# Patient Record
Sex: Female | Born: 1940 | Race: White | Hispanic: No | Smoking: Former smoker
Health system: Southern US, Community
[De-identification: ages and names within clinical notes are randomized; demographics above are authoritative.]

## PROBLEM LIST (undated history)

## (undated) DIAGNOSIS — Z8673 Personal history of transient ischemic attack (TIA), and cerebral infarction without residual deficits: Secondary | ICD-10-CM

## (undated) DIAGNOSIS — E785 Hyperlipidemia, unspecified: Secondary | ICD-10-CM

## (undated) DIAGNOSIS — Z87891 Personal history of nicotine dependence: Secondary | ICD-10-CM

## (undated) DIAGNOSIS — C569 Malignant neoplasm of unspecified ovary: Secondary | ICD-10-CM

## (undated) DIAGNOSIS — R000 Tachycardia, unspecified: Secondary | ICD-10-CM

## (undated) DIAGNOSIS — F32A Depression, unspecified: Secondary | ICD-10-CM

## (undated) DIAGNOSIS — F419 Anxiety disorder, unspecified: Secondary | ICD-10-CM

## (undated) DIAGNOSIS — E669 Obesity, unspecified: Secondary | ICD-10-CM

## (undated) HISTORY — PX: TOTAL MASTECTOMY: SHX6129

## (undated) HISTORY — DX: Anxiety disorder, unspecified: F41.9

## (undated) HISTORY — DX: Obesity, unspecified: E66.9

## (undated) HISTORY — DX: Depression, unspecified: F32.A

## (undated) HISTORY — PX: BREAST SURGERY: SHX581

## (undated) HISTORY — DX: Personal history of transient ischemic attack (TIA), and cerebral infarction without residual deficits: Z86.73

## (undated) HISTORY — DX: Hyperlipidemia, unspecified: E78.5

## (undated) HISTORY — DX: Malignant neoplasm of unspecified ovary: C56.9

---

## 2005-04-01 ENCOUNTER — Emergency Department: Payer: Self-pay | Admitting: Emergency Medicine

## 2009-03-19 ENCOUNTER — Ambulatory Visit: Payer: Self-pay | Admitting: Internal Medicine

## 2009-03-20 ENCOUNTER — Ambulatory Visit: Payer: Self-pay | Admitting: Internal Medicine

## 2012-06-21 ENCOUNTER — Ambulatory Visit: Payer: Self-pay | Admitting: Family Medicine

## 2012-08-01 ENCOUNTER — Ambulatory Visit: Payer: Self-pay | Admitting: Family Medicine

## 2012-10-03 DIAGNOSIS — F411 Generalized anxiety disorder: Secondary | ICD-10-CM | POA: Insufficient documentation

## 2012-10-03 DIAGNOSIS — F32A Depression, unspecified: Secondary | ICD-10-CM | POA: Insufficient documentation

## 2013-07-13 ENCOUNTER — Other Ambulatory Visit: Payer: Self-pay

## 2013-07-13 LAB — CBC WITH DIFFERENTIAL/PLATELET
Basophil #: 0 10*3/uL (ref 0.0–0.1)
Basophil %: 0.2 %
Eosinophil #: 0 10*3/uL (ref 0.0–0.7)
Eosinophil %: 0.4 %
HCT: 25.2 % — ABNORMAL LOW (ref 35.0–47.0)
HGB: 8.9 g/dL — ABNORMAL LOW (ref 12.0–16.0)
MCH: 32.6 pg (ref 26.0–34.0)
MCHC: 35.4 g/dL (ref 32.0–36.0)
MCV: 92 fL (ref 80–100)
Monocyte #: 0.6 x10 3/mm (ref 0.2–0.9)
Monocyte %: 8.1 %
Neutrophil %: 73.8 %
Platelet: 49 10*3/uL — ABNORMAL LOW (ref 150–440)
RDW: 18.9 % — ABNORMAL HIGH (ref 11.5–14.5)

## 2013-07-26 IMAGING — CR DG IVP HYPERTENSIVE
1 series · 8 of 10 positions shown · non-contrast
Comparison: none

REASON FOR EXAM: hematuria
COMMENTS:

PROCEDURE:     DXR - DXR INTRAVENOUS UROGRAPHY (IVP)  - June 21, 2012  [DATE]
RESULT:     Indication: Hematuria

[Series 1: scout · 0.17mm/px · 8 of 16 slices shown]
[im 1/16]
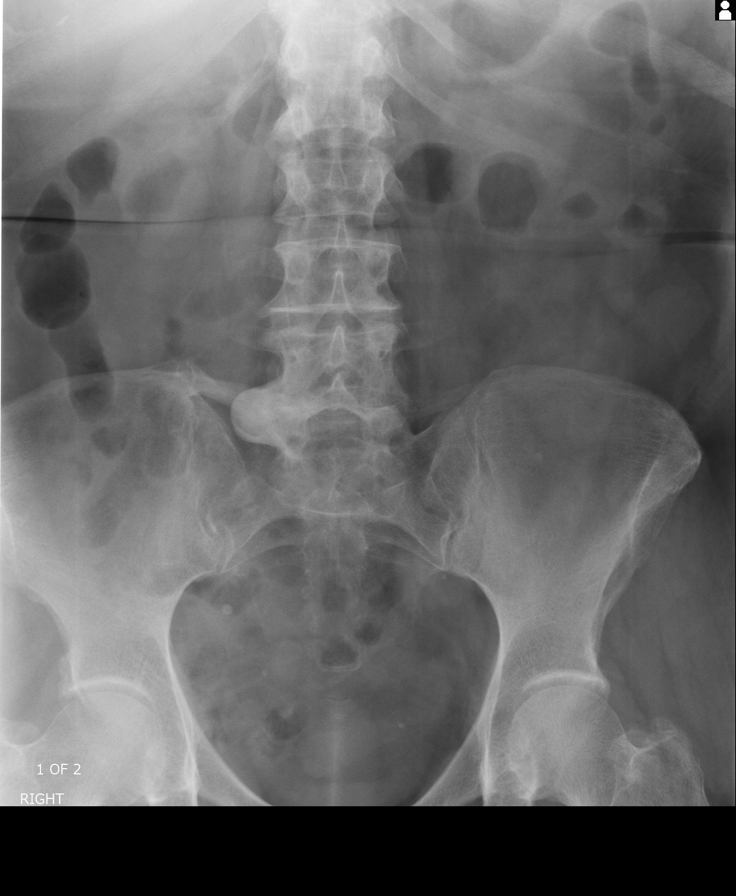
[im 2/16]
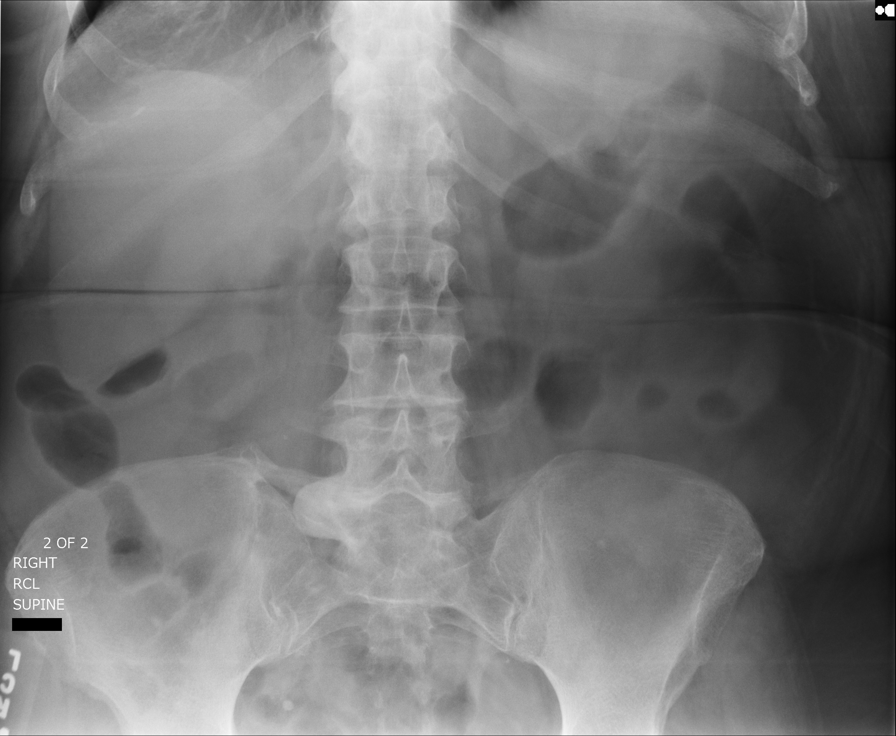
[im 4/16]
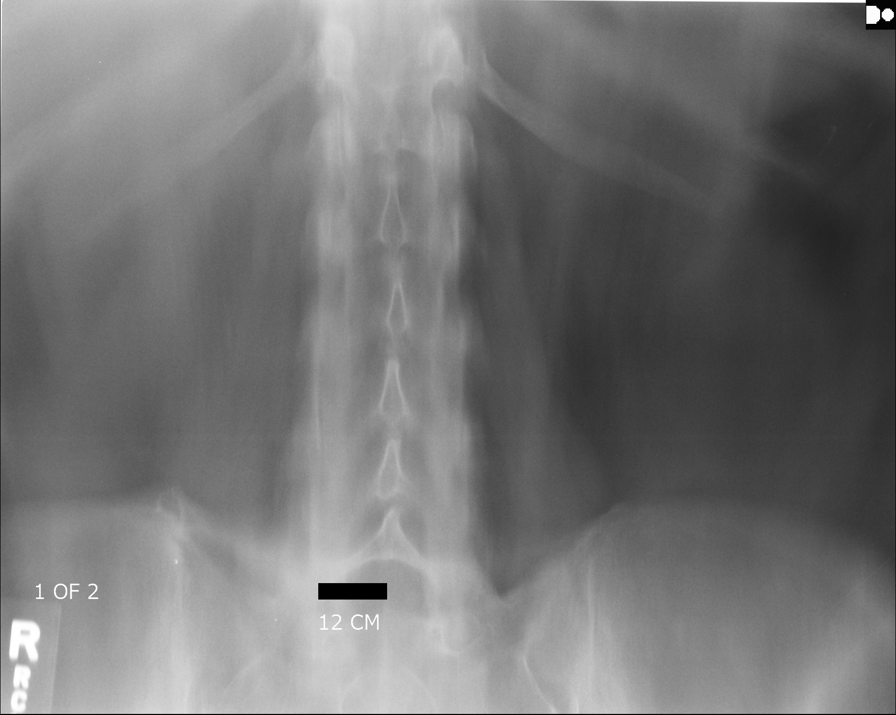
[im 6/16]
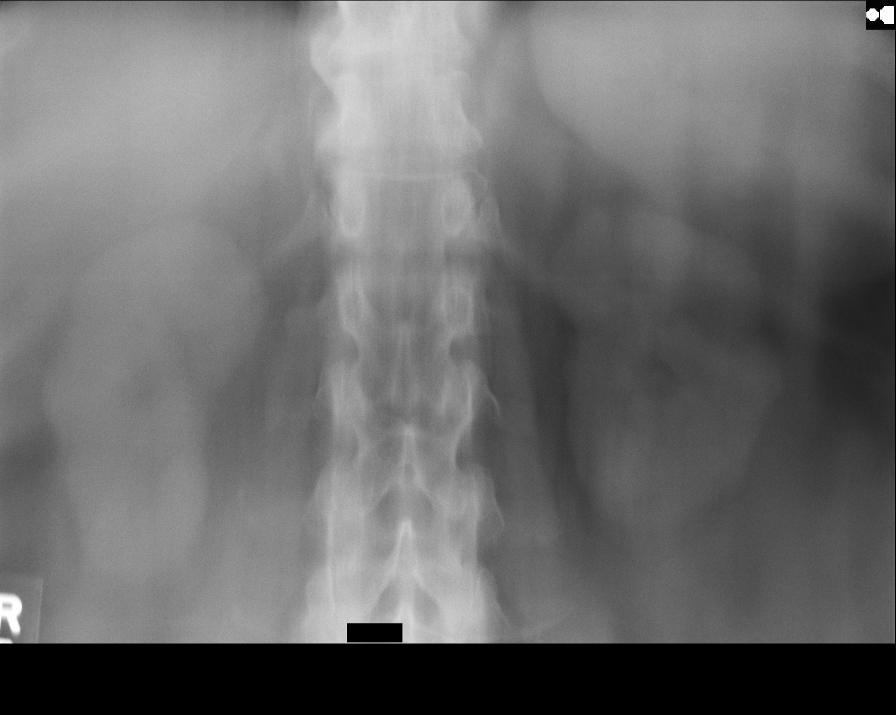
[im 7/16]
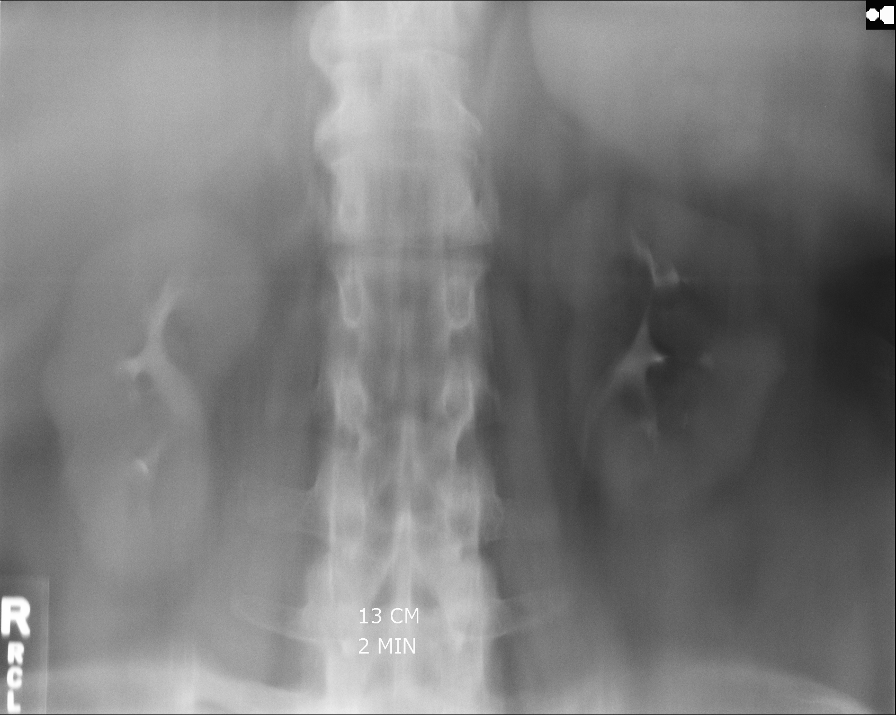
[im 9/16]
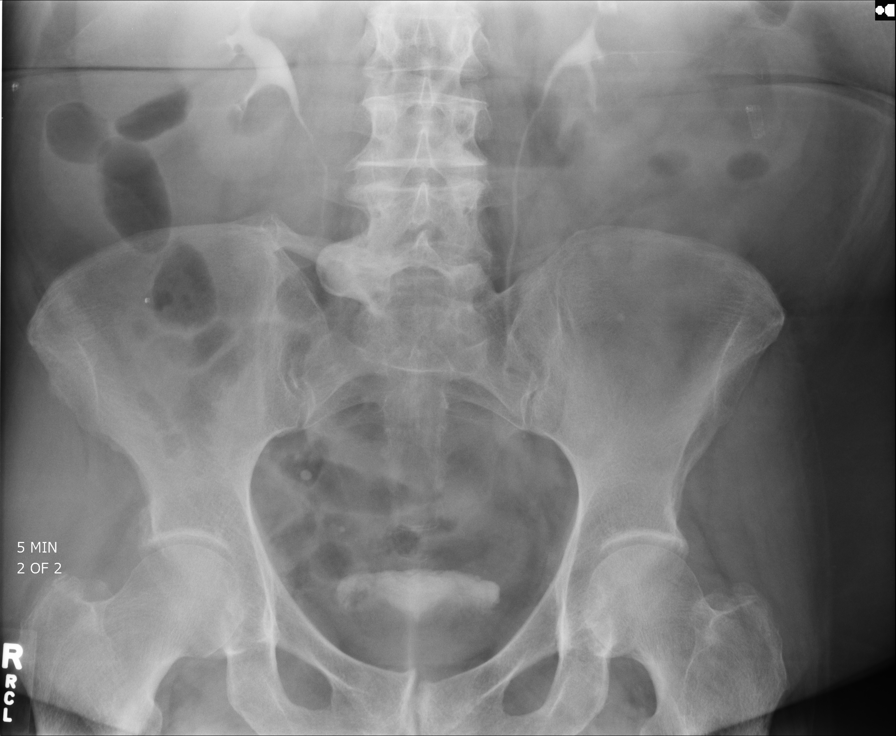
[im 11/16]
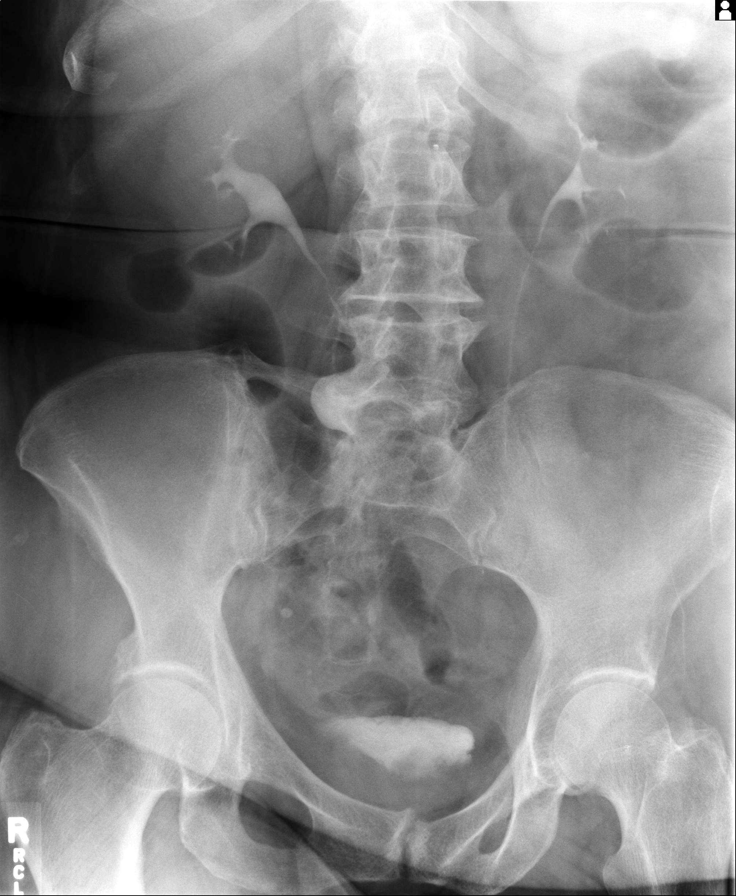
[im 12/16]
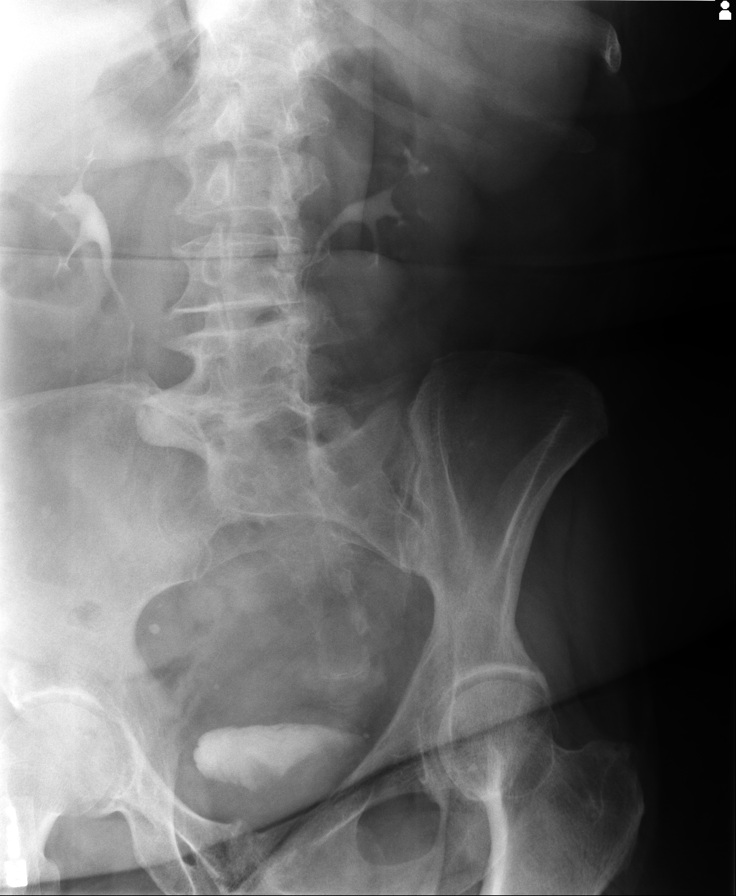

[8 of 10 positions shown; findings below may reference images not displayed]

FINDINGS: Scout frontal abdominal and pelvic radiograph demonstrates bowel gas in
multiple loops of nondilated small and large bowel. No evidence for bowel
obstruction or adynamic ileus. There are no calcific densities overlying the
kidneys to suggest renal calculi.

Precontrast tomograms were obtained at 12 cm. The kidneys are normal in
size. Renal contours are normal bilaterally without evidence for exophytic
mass.

The kidneys enhance symmetrically and excrete contrast promptly following
the uneventful administration of 50 mL of Bsovue-LYY. The overhead
radiograph demonstrates contrast within the proximal ureters bilaterally.
Compression tomograms reveal the collecting systems to be unremarkable with
sharp calyces and without evidence of filling defects or pelvicaliectasis.
The oblique radiographs demonstrate symmetric filling of the ureters. They
are of normal caliber without evidence of ureterectasis or filling defects.
Contrast is present in the bladder within 5 minutes and there are no
definite filling defects identified within the bladder.

Post void radiograph reveals a minimal amount of residual contrast within
the collecting systems and bladder.
IMPRESSION: 1. No evidence of stones or filling defects within the kidneys, ureter, or
bladder.

[REDACTED]

## 2016-03-04 ENCOUNTER — Inpatient Hospital Stay: Admit: 2016-03-04 | Payer: Self-pay

## 2016-06-06 DIAGNOSIS — Z87898 Personal history of other specified conditions: Secondary | ICD-10-CM | POA: Insufficient documentation

## 2018-12-03 DIAGNOSIS — E785 Hyperlipidemia, unspecified: Secondary | ICD-10-CM | POA: Insufficient documentation

## 2022-03-21 ENCOUNTER — Emergency Department: Payer: Medicare Other

## 2022-03-21 ENCOUNTER — Other Ambulatory Visit: Payer: Self-pay

## 2022-03-21 ENCOUNTER — Encounter: Payer: Self-pay | Admitting: Internal Medicine

## 2022-03-21 ENCOUNTER — Observation Stay
Admission: EM | Admit: 2022-03-21 | Discharge: 2022-03-22 | Disposition: A | Discharge status: Home / Self Care | Payer: Medicare Other | Attending: Obstetrics and Gynecology | Admitting: Obstetrics and Gynecology

## 2022-03-21 DIAGNOSIS — E669 Obesity, unspecified: Secondary | ICD-10-CM | POA: Diagnosis present

## 2022-03-21 DIAGNOSIS — N3001 Acute cystitis with hematuria: Secondary | ICD-10-CM

## 2022-03-21 DIAGNOSIS — Z87891 Personal history of nicotine dependence: Secondary | ICD-10-CM | POA: Diagnosis not present

## 2022-03-21 DIAGNOSIS — R000 Tachycardia, unspecified: Secondary | ICD-10-CM | POA: Diagnosis not present

## 2022-03-21 DIAGNOSIS — G459 Transient cerebral ischemic attack, unspecified: Secondary | ICD-10-CM

## 2022-03-21 DIAGNOSIS — I749 Embolism and thrombosis of unspecified artery: Secondary | ICD-10-CM

## 2022-03-21 DIAGNOSIS — Z8543 Personal history of malignant neoplasm of ovary: Secondary | ICD-10-CM | POA: Diagnosis not present

## 2022-03-21 DIAGNOSIS — R4781 Slurred speech: Secondary | ICD-10-CM

## 2022-03-21 DIAGNOSIS — R4701 Aphasia: Secondary | ICD-10-CM | POA: Diagnosis not present

## 2022-03-21 DIAGNOSIS — I4891 Unspecified atrial fibrillation: Secondary | ICD-10-CM

## 2022-03-21 DIAGNOSIS — Z79899 Other long term (current) drug therapy: Secondary | ICD-10-CM | POA: Diagnosis not present

## 2022-03-21 DIAGNOSIS — Z20822 Contact with and (suspected) exposure to covid-19: Secondary | ICD-10-CM | POA: Insufficient documentation

## 2022-03-21 DIAGNOSIS — R030 Elevated blood-pressure reading, without diagnosis of hypertension: Secondary | ICD-10-CM

## 2022-03-21 DIAGNOSIS — R4182 Altered mental status, unspecified: Secondary | ICD-10-CM | POA: Diagnosis not present

## 2022-03-21 DIAGNOSIS — R531 Weakness: Secondary | ICD-10-CM | POA: Diagnosis present

## 2022-03-21 DIAGNOSIS — R471 Dysarthria and anarthria: Secondary | ICD-10-CM | POA: Diagnosis not present

## 2022-03-21 DIAGNOSIS — I4811 Longstanding persistent atrial fibrillation: Secondary | ICD-10-CM | POA: Insufficient documentation

## 2022-03-21 HISTORY — DX: Obesity, unspecified: E66.9

## 2022-03-21 HISTORY — DX: Embolism and thrombosis of unspecified artery: G45.9

## 2022-03-21 HISTORY — DX: Personal history of nicotine dependence: Z87.891

## 2022-03-21 HISTORY — DX: Transient cerebral ischemic attack, unspecified: I74.9

## 2022-03-21 HISTORY — DX: Tachycardia, unspecified: R00.0

## 2022-03-21 HISTORY — DX: Unspecified atrial fibrillation: I48.91

## 2022-03-21 LAB — CBC WITH DIFFERENTIAL/PLATELET
Abs Immature Granulocytes: 0.02 10*3/uL (ref 0.00–0.07)
Basophils Absolute: 0 10*3/uL (ref 0.0–0.1)
Basophils Relative: 0 %
Eosinophils Absolute: 0.2 10*3/uL (ref 0.0–0.5)
Eosinophils Relative: 2 %
HCT: 40.9 % (ref 36.0–46.0)
Hemoglobin: 12.9 g/dL (ref 12.0–15.0)
Immature Granulocytes: 0 %
Lymphocytes Relative: 21 %
Lymphs Abs: 1.8 10*3/uL (ref 0.7–4.0)
MCH: 31.1 pg (ref 26.0–34.0)
MCHC: 31.5 g/dL (ref 30.0–36.0)
MCV: 98.6 fL (ref 80.0–100.0)
Monocytes Absolute: 1.1 10*3/uL — ABNORMAL HIGH (ref 0.1–1.0)
Monocytes Relative: 13 %
Neutro Abs: 5.3 10*3/uL (ref 1.7–7.7)
Neutrophils Relative %: 64 %
Platelets: 167 10*3/uL (ref 150–400)
RBC: 4.15 MIL/uL (ref 3.87–5.11)
RDW: 12.7 % (ref 11.5–15.5)
WBC: 8.4 10*3/uL (ref 4.0–10.5)
nRBC: 0 % (ref 0.0–0.2)

## 2022-03-21 LAB — BASIC METABOLIC PANEL
Anion gap: 12 (ref 5–15)
BUN: 23 mg/dL (ref 8–23)
CO2: 24 mmol/L (ref 22–32)
Calcium: 8.6 mg/dL — ABNORMAL LOW (ref 8.9–10.3)
Chloride: 102 mmol/L (ref 98–111)
Creatinine, Ser: 0.99 mg/dL (ref 0.44–1.00)
GFR, Estimated: 58 mL/min — ABNORMAL LOW (ref >60)
Glucose, Bld: 99 mg/dL (ref 70–99)
Potassium: 3.6 mmol/L (ref 3.5–5.1)
Sodium: 138 mmol/L (ref 135–145)

## 2022-03-21 LAB — ETHANOL: Alcohol, Ethyl (B): <10 mg/dL (ref <10)

## 2022-03-21 LAB — COMPREHENSIVE METABOLIC PANEL
ALT: 7 U/L (ref 0–44)
AST: 11 U/L — ABNORMAL LOW (ref 15–41)
Albumin: 3.6 g/dL (ref 3.5–5.0)
Alkaline Phosphatase: 73 U/L (ref 38–126)
Anion gap: 13 (ref 5–15)
BUN: 22 mg/dL (ref 8–23)
CO2: 24 mmol/L (ref 22–32)
Calcium: 8.8 mg/dL — ABNORMAL LOW (ref 8.9–10.3)
Chloride: 104 mmol/L (ref 98–111)
Creatinine, Ser: 1.03 mg/dL — ABNORMAL HIGH (ref 0.44–1.00)
GFR, Estimated: 55 mL/min — ABNORMAL LOW (ref >60)
Glucose, Bld: 104 mg/dL — ABNORMAL HIGH (ref 70–99)
Potassium: 3.5 mmol/L (ref 3.5–5.1)
Sodium: 141 mmol/L (ref 135–145)
Total Bilirubin: 1 mg/dL (ref 0.3–1.2)
Total Protein: 7.3 g/dL (ref 6.5–8.1)

## 2022-03-21 LAB — CBG MONITORING, ED: Glucose-Capillary: 94 mg/dL (ref 70–99)

## 2022-03-21 LAB — URINALYSIS, ROUTINE W REFLEX MICROSCOPIC
Bilirubin Urine: NEGATIVE
Glucose, UA: NEGATIVE mg/dL
Hgb urine dipstick: NEGATIVE
Ketones, ur: NEGATIVE mg/dL
Nitrite: POSITIVE — AB
Protein, ur: NEGATIVE mg/dL
Specific Gravity, Urine: >1.046 — ABNORMAL HIGH (ref 1.005–1.030)
pH: 5 (ref 5.0–8.0)

## 2022-03-21 LAB — URINE DRUG SCREEN, QUALITATIVE (ARMC ONLY)
Amphetamines, Ur Screen: NOT DETECTED
Barbiturates, Ur Screen: NOT DETECTED
Benzodiazepine, Ur Scrn: NOT DETECTED
Cannabinoid 50 Ng, Ur ~~LOC~~: NOT DETECTED
Cocaine Metabolite,Ur ~~LOC~~: NOT DETECTED
MDMA (Ecstasy)Ur Screen: NOT DETECTED
Methadone Scn, Ur: NOT DETECTED
Opiate, Ur Screen: NOT DETECTED
Phencyclidine (PCP) Ur S: NOT DETECTED
Tricyclic, Ur Screen: NOT DETECTED

## 2022-03-21 LAB — RESP PANEL BY RT-PCR (FLU A&B, COVID) ARPGX2
Influenza A by PCR: NEGATIVE
Influenza B by PCR: NEGATIVE
SARS Coronavirus 2 by RT PCR: NEGATIVE

## 2022-03-21 LAB — APTT: aPTT: 30 seconds (ref 24–36)

## 2022-03-21 LAB — PROTIME-INR
INR: 1.1 (ref 0.8–1.2)
Prothrombin Time: 14.1 seconds (ref 11.4–15.2)

## 2022-03-21 MED ORDER — SODIUM CHLORIDE 0.9 % IV SOLN
2.0000 g | Freq: Once | INTRAVENOUS | Status: AC
  Administered 2022-03-21: 2 g via INTRAVENOUS
  Filled 2022-03-21: qty 20

## 2022-03-21 MED ORDER — IOHEXOL 350 MG/ML SOLN
75.0000 mL | Freq: Once | INTRAVENOUS | Status: AC | PRN
  Administered 2022-03-21: 75 mL via INTRAVENOUS

## 2022-03-21 MED ORDER — LACTATED RINGERS IV BOLUS
1000.0000 mL | Freq: Once | INTRAVENOUS | Status: AC
  Administered 2022-03-21: 1000 mL via INTRAVENOUS

## 2022-03-21 NOTE — ED Notes (Signed)
Pt to CT at this time.

## 2022-03-21 NOTE — Consult Note (Signed)
[facility] Telemedicine Consult ? ? ?Requesting Provider: Dr. Su Hoff ?Consult Participants: Dr. Jerelyn Charles, Telespecialist RN-Brittany   bedside RN Katie/Bill ?Location of the provider: Home ?Location of the patient: [facility] ER bed 6 ? ?This consult was provided via telemedicine with 2-way video and audio communication. The patient/family was informed that care would be provided in this way and agreed to receive care in this manner.  ? ? ?Chief Complaint: Weakness, aphasia, slurred speech ? ?HPI: 81 year old woman who has a past medical history of anxiety, obesity, dyslipidemia, ovarian malignancy, recurrent major depression currently in remission, brought into the emergency room for evaluation of intermittent episodes of weakness and slurred speech, the last 1 of which was 1 week ago when she had some weakness which was generalized as well as slurred speech.  It resolved spontaneously-she cannot tell me how long it lasted.  Today this episode happened again, EMS was called and her symptoms resolved. ?In the emergency room, this happened again sometime around 7:15 PM for which a code stroke was activated by the ED providers. ?Patient reports never having a stroke in the past. ?She reports a lot of anxiety due to some housing changes recently. ?She has a son who is looking for transportation to come here but is not at bedside at this time. ? ? ?No past medical history on file. ? ?No current facility-administered medications for this encounter. ?No current outpatient medications on file. ? ? ? ?LKW: 7:15 PM ?tpa given?: No, too mild to treat ?IR Thrombectomy? No, symptoms not consistent with LVO ?Modified Rankin-3 ?Time of teleneurologist evaluation: 1929 hrs. ? ?Exam: ?Vitals:  ? 03/21/22 1901  ?BP: (!) 153/43  ?Pulse: (!) 51  ?Resp: (!) 27  ?Temp: 98.1 ?F (36.7 ?C)  ?SpO2: 99%  ? ? ?General: Awake alert in no distress ?Neurological exam ?Awake alert oriented x2 ?Speech is not  dysarthric ?No evidence of aphasia ?Cranial nerves II to XII intact ?Motor examination with symmetric drift in bilateral lower extremities, no drift in the upper extremities although there is some restricted motion in the right shoulder which is chronic. ?Sensation diminished in the right hand but other than that intact. ?No gross dysmetria ? ? ? ?NIHSS ?1A: Level of Consciousness - 0 ?1B: Ask Month and Age - 1 ?1C: 'Blink Eyes' & 'Squeeze Hands' - 0 ?2: Test Horizontal Extraocular Movements - 0 ?3: Test Visual Fields - 0 ?4: Test Facial Palsy - 0 ?5A: Test Left Arm Motor Drift - 0 ?5B: Test Right Arm Motor Drift - 0 ?6A: Test Left Leg Motor Drift - 1 ?6B: Test Right Leg Motor Drift - 1 ?7: Test Limb Ataxia - 0 ?8: Test Sensation - 1 ?9: Test Language/Aphasia- 0 ?10: Test Dysarthria - 0 ?11: Test Extinction/Inattention - 0 ?NIHSS score: 4 ? ? ?Imaging Reviewed: CT head-no acute changes.  Chronic microvascular ischemia.  Old left frontal infarct. ? ?Labs reviewed in epic and pertinent values follow: ?CBC ?   ?Component Value Date/Time  ? WBC 8.4 03/21/2022 1849  ? RBC 4.15 03/21/2022 1849  ? HGB 12.9 03/21/2022 1849  ? HGB 8.9 (L) 07/13/2013 1209  ? HCT 40.9 03/21/2022 1849  ? HCT 25.2 (L) 07/13/2013 1209  ? PLT 167 03/21/2022 1849  ? PLT 49 (L) 07/13/2013 1209  ? MCV 98.6 03/21/2022 1849  ? MCV 92 07/13/2013 1209  ? MCH 31.1 03/21/2022 1849  ? MCHC 31.5 03/21/2022 1849  ? RDW 12.7 03/21/2022 1849  ? RDW 18.9 (H)  07/13/2013 1209  ? LYMPHSABS 1.8 03/21/2022 1849  ? LYMPHSABS 1.2 07/13/2013 1209  ? MONOABS 1.1 (H) 03/21/2022 1849  ? MONOABS 0.6 07/13/2013 1209  ? EOSABS 0.2 03/21/2022 1849  ? EOSABS 0.0 07/13/2013 1209  ? BASOSABS 0.0 03/21/2022 1849  ? BASOSABS 0.0 07/13/2013 1209  ? ?CMP  ?No results found for: NA, K, CL, CO2, GLUCOSE, BUN, CREATININE, CALCIUM, PROT, ALBUMIN, AST, ALT, ALKPHOS, BILITOT, GFRNONAA, GFRAA ?CBG 94 ? ?Assessment:  ?81 year old woman past history of obesity, dyslipidemia, ovarian  malignancy, depression and anxiety brought in for evaluation of intermittent weakness and slurred speech for the past week.  Symptoms recurred this evening, EMS was called, symptoms completely resolved and reappeared in the emergency room before resolving spontaneously. ?On my examination, she has symmetric lower extremity weakness as well as could not answer one of the LOC questions but other than that and right upper extremity difference in sensation compared to left, she has an exam that is not extremely focal. ?NIH stroke scale is too low to treat versus nonfocal exam versus stroke mimic is a reason not to use IV TNKase. ? ? ?Recommendations:  ?-Stat CTA head and neck to rule out any evidence of intraluminal thrombus or occlusion causing fluctuating symptoms. ?-After that, stat MRI to rule out a fluctuating lacunar infarct. ?-If the MRI reveals a stroke, admit for full stroke work-up to include 2D echo, A1c, lipid panel, telemetry, frequent neurochecks. ?-If MRI is negative, check chest x-ray, urinalysis, B12 levels-to rule out common causes of encephalopathy in adults ?Home medication list currently not updated-chart review from 08/05/2021 family medicine note shows aspirin 81 in addition to atenolol 50, clonazepam, fluoxetine, Lasix 20 mg and potassium tablets as meds.  I would continue aspirin 81 for now. ?Plan discussed with Dr. Tamala Julian, patient and the patient's bedside care team. ?Please call with questions as needed. ? ? ? ?Discussed with EDP Dr Tamala Julian ? ?This patient is receiving care for possible acute neurological changes. There was 25 minutes of care by this provider at the time of service, including time for direct evaluation via telemedicine, review of medical records, imaging studies and discussion of findings with providers, the patient and/or family. ? ?-- ?Amie Portland, MD ?[facility] ?Pager: [phone] ?If 7pm to 7am, please call on call as listed on AMION. ? ? ?

## 2022-03-21 NOTE — Progress Notes (Signed)
?   03/21/22 2000  ?Clinical Encounter Type  ?Visited With Patient  ?Visit Type Initial;Code  ?Referral From Other (Comment)  ?Spiritual Encounters  ?Spiritual Needs Other (Comment) ?(social support)  ? ?Daryel November f/u on code stroke that was initiated at 1925 while responding to another critical case. Chaplain Burris offered non-anxious, compassionate presence, and inquired about Pt's well-being. Pt was talkative and did not have anyone present with her, so seemed to appreciate the company. Pt shared that there has been a lot of stressful events in past days relating to a difficult move that is still in progress. Pt has an adult son that lives with her but there is no transportation; adult daughter in [place] but she has had long-term cancer. ? ?Chaplain B provided active listening as Ms. Lave shared her story and urged her to now release on the stresses of the move and to now focus on her health. Visit concluded as tech came for xrays. Pt seemed to appreciate the supportive visit. ?

## 2022-03-21 NOTE — H&P (Addendum)
?History and Physical  ? ? ?Patient: Sydney Vaughan INO:[id number] DOB: Jan 02, 1941 ?DOA: 03/21/2022 ?DOS: the patient was seen and examined on 03/22/2022 ?PCP: System, Provider Not In  ?Patient coming from: Home ? ?Chief Complaint:  ?Chief Complaint  ?Patient presents with  ? Weakness  ? ?HPI: Sydney Vaughan is a 81 y.o. female with medical history significant of obesity, history of tobacco abuse, history of tachycardia on atenolol, ovarian cancer status post hysterectomy and bilateral mastectomy, depression presenting with altered mental status. ?Patient states that she was doing something in the home and was not able to speak and could not get her words out and per report this is about 45 minutes which resolved.  This has happened in the past once before.  Patient currently does not report any headaches blurred vision speech or gait issues fevers chills chest pain palpitations.  Review of systems is otherwise negative.  Upon arrival to the emergency room a code stroke was called on patient had neurological imaging with negative findings on CT and MRI. ? ?Review of Systems  ?Neurological:  Positive for speech change and weakness.  ?All other systems reviewed and are negative. ? ?Past Medical History:  ?Diagnosis Date  ? History of tobacco abuse   ? Obesity   ? Tachycardia   ? ?History reviewed. No pertinent surgical history. ?Social History:  reports that she has quit smoking. Her smoking use included cigarettes. She has never used smokeless tobacco. No history on file for alcohol use and drug use. ? ?No Known Allergies ? ?History reviewed. No pertinent family history. ? ?Prior to Admission medications   ?Not on File  ? ? ?Physical Exam: ?Vitals:  ? 03/21/22 2145 03/21/22 2315 03/21/22 2330 03/22/22 0100  ?BP: (!) 170/82 (!) 162/66 (!) 166/100 (!) 141/68  ?Pulse: (!) 49 (!) 47 (!) 52 (!) 49  ?Resp: '17 14 20 16  '$ ?Temp:    98.1 ?F (36.7 ?C)  ?TempSrc:      ?SpO2: 100% 98% 96% 96%  ?Weight:      ?Physical Exam ?Vitals and  nursing note reviewed.  ?Constitutional:   ?   General: She is not in acute distress. ?   Appearance: Normal appearance. She is not ill-appearing, toxic-appearing or diaphoretic.  ?HENT:  ?   Head: Normocephalic and atraumatic.  ?   Right Ear: Hearing and external ear normal.  ?   Left Ear: Hearing and external ear normal.  ?   Nose: Nose normal. No nasal deformity.  ?   Mouth/Throat:  ?   Lips: Pink.  ?   Mouth: Mucous membranes are moist.  ?   Tongue: No lesions.  ?   Pharynx: Oropharynx is clear.  ?Eyes:  ?   Extraocular Movements: Extraocular movements intact.  ?   Pupils: Pupils are equal, round, and reactive to light.  ?Neck:  ?   Vascular: No carotid bruit.  ?Cardiovascular:  ?   Rate and Rhythm: Normal rate and regular rhythm.  ?   Pulses: Normal pulses.  ?   Heart sounds: Normal heart sounds.  ?Pulmonary:  ?   Effort: Pulmonary effort is normal.  ?   Breath sounds: Normal breath sounds.  ?Abdominal:  ?   General: Bowel sounds are normal. There is no distension.  ?   Palpations: Abdomen is soft. There is no mass.  ?   Tenderness: There is no abdominal tenderness. There is no guarding.  ?   Hernia: No hernia is present.  ?Musculoskeletal:  ?  Right lower leg: No edema.  ?   Left lower leg: No edema.  ?Skin: ?   General: Skin is warm.  ?Neurological:  ?   General: No focal deficit present.  ?   Mental Status: She is alert and oriented to person, place, and time.  ?   Cranial Nerves: Cranial nerves 2-12 are intact.  ?   Motor: Motor function is intact.  ?Psychiatric:     ?   Attention and Perception: Attention normal.     ?   Mood and Affect: Mood normal.     ?   Speech: Speech normal.     ?   Behavior: Behavior normal. Behavior is cooperative.     ?   Cognition and Memory: Cognition normal.  ? ? ?Data Reviewed: ?Results for orders placed or performed during the hospital encounter of 03/21/22 (from the past 24 hour(s))  ?Comprehensive metabolic panel     Status: Abnormal  ? Collection Time: 03/21/22  6:49 PM   ?Result Value Ref Range  ? Sodium 141 135 - 145 mmol/L  ? Potassium 3.5 3.5 - 5.1 mmol/L  ? Chloride 104 98 - 111 mmol/L  ? CO2 24 22 - 32 mmol/L  ? Glucose, Bld 104 (H) 70 - 99 mg/dL  ? BUN 22 8 - 23 mg/dL  ? Creatinine, Ser 1.03 (H) 0.44 - 1.00 mg/dL  ? Calcium 8.8 (L) 8.9 - 10.3 mg/dL  ? Total Protein 7.3 6.5 - 8.1 g/dL  ? Albumin 3.6 3.5 - 5.0 g/dL  ? AST 11 (L) 15 - 41 U/L  ? ALT 7 0 - 44 U/L  ? Alkaline Phosphatase 73 38 - 126 U/L  ? Total Bilirubin 1.0 0.3 - 1.2 mg/dL  ? GFR, Estimated 55 (L) >60 mL/min  ? Anion gap 13 5 - 15  ?CBC with Differential/Platelet     Status: Abnormal  ? Collection Time: 03/21/22  6:49 PM  ?Result Value Ref Range  ? WBC 8.4 4.0 - 10.5 K/uL  ? RBC 4.15 3.87 - 5.11 MIL/uL  ? Hemoglobin 12.9 12.0 - 15.0 g/dL  ? HCT 40.9 36.0 - 46.0 %  ? MCV 98.6 80.0 - 100.0 fL  ? MCH 31.1 26.0 - 34.0 pg  ? MCHC 31.5 30.0 - 36.0 g/dL  ? RDW 12.7 11.5 - 15.5 %  ? Platelets 167 150 - 400 K/uL  ? nRBC 0.0 0.0 - 0.2 %  ? Neutrophils Relative % 64 %  ? Neutro Abs 5.3 1.7 - 7.7 K/uL  ? Lymphocytes Relative 21 %  ? Lymphs Abs 1.8 0.7 - 4.0 K/uL  ? Monocytes Relative 13 %  ? Monocytes Absolute 1.1 (H) 0.1 - 1.0 K/uL  ? Eosinophils Relative 2 %  ? Eosinophils Absolute 0.2 0.0 - 0.5 K/uL  ? Basophils Relative 0 %  ? Basophils Absolute 0.0 0.0 - 0.1 K/uL  ? Immature Granulocytes 0 %  ? Abs Immature Granulocytes 0.02 0.00 - 0.07 K/uL  ?Ethanol     Status: None  ? Collection Time: 03/21/22  6:49 PM  ?Result Value Ref Range  ? Alcohol, Ethyl (B) <10 <10 mg/dL  ?Protime-INR     Status: None  ? Collection Time: 03/21/22  6:49 PM  ?Result Value Ref Range  ? Prothrombin Time 14.1 11.4 - 15.2 seconds  ? INR 1.1 0.8 - 1.2  ?APTT     Status: None  ? Collection Time: 03/21/22  6:49 PM  ?Result Value Ref Range  ? aPTT  30 24 - 36 seconds  ?CBG monitoring, ED     Status: None  ? Collection Time: 03/21/22  7:25 PM  ?Result Value Ref Range  ? Glucose-Capillary 94 70 - 99 mg/dL  ?Urinalysis, Routine w reflex microscopic      Status: Abnormal  ? Collection Time: 03/21/22  8:03 PM  ?Result Value Ref Range  ? Color, Urine YELLOW (A) YELLOW  ? APPearance HAZY (A) CLEAR  ? Specific Gravity, Urine >1.046 (H) 1.005 - 1.030  ? pH 5.0 5.0 - 8.0  ? Glucose, UA NEGATIVE NEGATIVE mg/dL  ? Hgb urine dipstick NEGATIVE NEGATIVE  ? Bilirubin Urine NEGATIVE NEGATIVE  ? Ketones, ur NEGATIVE NEGATIVE mg/dL  ? Protein, ur NEGATIVE NEGATIVE mg/dL  ? Nitrite POSITIVE (A) NEGATIVE  ? Leukocytes,Ua LARGE (A) NEGATIVE  ? RBC / HPF 6-10 0 - 5 RBC/hpf  ? WBC, UA 21-50 0 - 5 WBC/hpf  ? Bacteria, UA FEW (A) NONE SEEN  ? Squamous Epithelial / LPF 0-5 0 - 5  ?Urine Drug Screen, Qualitative     Status: None  ? Collection Time: 03/21/22  8:03 PM  ?Result Value Ref Range  ? Tricyclic, Ur Screen NONE DETECTED NONE DETECTED  ? Amphetamines, Ur Screen NONE DETECTED NONE DETECTED  ? MDMA (Ecstasy)Ur Screen NONE DETECTED NONE DETECTED  ? Cocaine Metabolite,Ur [place] NONE DETECTED NONE DETECTED  ? Opiate, Ur Screen NONE DETECTED NONE DETECTED  ? Phencyclidine (PCP) Ur S NONE DETECTED NONE DETECTED  ? Cannabinoid 50 Ng, Ur [place] NONE DETECTED NONE DETECTED  ? Barbiturates, Ur Screen NONE DETECTED NONE DETECTED  ? Benzodiazepine, Ur Scrn NONE DETECTED NONE DETECTED  ? Methadone Scn, Ur NONE DETECTED NONE DETECTED  ?Magnesium     Status: None  ? Collection Time: 03/21/22  8:03 PM  ?Result Value Ref Range  ? Magnesium 2.0 1.7 - 2.4 mg/dL  ?Resp Panel by RT-PCR (Flu A&B, Covid) Nasopharyngeal Swab     Status: None  ? Collection Time: 03/21/22  8:21 PM  ? Specimen: Nasopharyngeal Swab; Nasopharyngeal(NP) swabs in vial transport medium  ?Result Value Ref Range  ? SARS Coronavirus 2 by RT PCR NEGATIVE NEGATIVE  ? Influenza A by PCR NEGATIVE NEGATIVE  ? Influenza B by PCR NEGATIVE NEGATIVE  ?Basic metabolic panel     Status: Abnormal  ? Collection Time: 03/21/22  8:23 PM  ?Result Value Ref Range  ? Sodium 138 135 - 145 mmol/L  ? Potassium 3.6 3.5 - 5.1 mmol/L  ? Chloride 102 98 - 111 mmol/L   ? CO2 24 22 - 32 mmol/L  ? Glucose, Bld 99 70 - 99 mg/dL  ? BUN 23 8 - 23 mg/dL  ? Creatinine, Ser 0.99 0.44 - 1.00 mg/dL  ? Calcium 8.6 (L) 8.9 - 10.3 mg/dL  ? GFR, Estimated 58 (L) >60 mL/min  ? Anion gap

## 2022-03-21 NOTE — ED Notes (Signed)
Called Sydney Vaughan) for Code stroke @ 19:22 ?

## 2022-03-21 NOTE — ED Notes (Signed)
Pt family son Mikeal Hawthorne updated at this time. ?

## 2022-03-21 NOTE — ED Triage Notes (Signed)
Pt ems from home for intermittent weakness x 1 week. Today pt had slurred speech approx 45 minutes ago that resolved prior to arrival of ems at house.  ?

## 2022-03-21 NOTE — ED Provider Notes (Signed)
? ?[facility] ?Provider Note ? ? ? Event Date/Time  ? First MD Initiated Contact with Patient 03/21/22 1851   ?  (approximate) ? ? ?History  ? ?Weakness ? ? ?HPI ? ?Sydney Vaughan is a 81 y.o. female who presents to the ED for evaluation of Weakness ?  ?I reviewed PCP visit from 9/15.  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 ? ?Patient presents to the ED for evaluation of an episode of slurred speech that occurred transiently at home before arrival, since resolved.  She reports she was trying to eat something, but could not bring it to her mouth, try to speak with her son and could not get her words out.  This lasted 30-45 minutes and is now better.  She reports she is speaking at her baseline at this point. ? ?When she arrived to the ED she reports the better and does not have this deficit.  Shortly after arrival and I evaluate the patient a few minutes later, she has word finding difficulties, dysarthria and aphasia and stroke alert is called less than 1 hour after she arrived to the ED. ? ?Physical Exam  ? ?Triage Vital Signs: ?ED Triage Vitals  ?Enc Vitals Group  ?   BP 03/21/22 1901 (!) 153/43  ?   Pulse Rate 03/21/22 1901 (!) 51  ?   Resp 03/21/22 1901 (!) 27  ?   Temp 03/21/22 1901 98.1 ?F (36.7 ?C)  ?   Temp Source 03/21/22 1901 Oral  ?   SpO2 03/21/22 1901 99 %  ?   Weight 03/21/22 1904 229 lb 4.5 oz (104 kg)  ?   Height --   ?   Head Circumference --   ?   Peak Flow --   ?   Pain Score 03/21/22 1904 0  ?   Pain Loc --   ?   Pain Edu? --   ?   Excl. in [place]? --   ? ? ?Most recent vital signs: ?Vitals:  ? 03/21/22 2145 03/21/22 2315  ?BP: (!) 170/82 (!) 162/66  ?Pulse: (!) 49 (!) 47  ?Resp: 17 14  ?Temp:    ?SpO2: 100% 98%  ? ? ?General: Awake, no distress.  Fluent speech ?CV:  Good peripheral perfusion.  ?Resp:  Normal effort.  ?Abd:  No distention.  ?MSK:  No deformity  noted.  ?Neuro:  No focal deficits appreciated. Cranial nerves II through XII intact ?5/5 strength and sensation in all 4 extremities ?Other:   ? ? ?ED Results / Procedures / Treatments  ? ?Labs ?(all labs ordered are listed, but only abnormal results are displayed) ?Labs Reviewed  ?COMPREHENSIVE METABOLIC PANEL - Abnormal; Notable for the following components:  ?    Result Value  ? Glucose, Bld 104 (*)   ? Creatinine, Ser 1.03 (*)   ? Calcium 8.8 (*)   ? AST 11 (*)   ? GFR, Estimated 55 (*)   ? All other components within normal limits  ?CBC WITH DIFFERENTIAL/PLATELET - Abnormal; Notable for the following components:  ? Monocytes Absolute 1.1 (*)   ? All other components within normal limits  ?URINALYSIS, ROUTINE W REFLEX MICROSCOPIC - Abnormal; Notable for the following components:  ? Color, Urine YELLOW (*)   ? APPearance HAZY (*)   ? Specific Gravity, Urine >1.046 (*)   ? Nitrite POSITIVE (*)   ? Leukocytes,Ua LARGE (*)   ?  Bacteria, UA FEW (*)   ? All other components within normal limits  ?BASIC METABOLIC PANEL - Abnormal; Notable for the following components:  ? Calcium 8.6 (*)   ? GFR, Estimated 58 (*)   ? All other components within normal limits  ?RESP PANEL BY RT-PCR (FLU A&B, COVID) ARPGX2  ?URINE CULTURE  ?ETHANOL  ?PROTIME-INR  ?APTT  ?URINE DRUG SCREEN, QUALITATIVE ([facility] ONLY)  ?MAGNESIUM  ?CBG MONITORING, ED  ? ? ?EKG ?Sinus rhythm, rate of 51 bpm.  Normal axis and intervals.  No evidence of acute ischemia. ? ?RADIOLOGY ?CT head reviewed by me without evidence of acute intracranial pathology ? ? ?Official radiology report(s): ?DG Eye Foreign Body ? ?Result Date: 03/21/2022 ?CLINICAL DATA:  Screening for metal prior to MRI, intermittent weakness for 1 week EXAM: ORBITS FOR FOREIGN BODY - 2 VIEW COMPARISON:  None. FINDINGS: There is no evidence of metallic foreign body within the orbits. No significant bone abnormality identified. Dental amalgam is noted. IMPRESSION: No evidence of metallic foreign body  within the orbits. Electronically Signed   By: Randa Ngo M.D.   On: 03/21/2022 21:31  ? ?DG Chest 1 View ? ?Result Date: 03/21/2022 ?CLINICAL DATA:  Screening for metal prior to MRI, intermittent weakness for 1 week EXAM: CHEST  1 VIEW COMPARISON:  None. FINDINGS: Single frontal view of the chest demonstrates multiple cardiac leads overlying the chest. Cardiac silhouette is unremarkable. No airspace disease, effusion, or pneumothorax. No radiopaque foreign body. No acute fracture. IMPRESSION: 1. No acute intrathoracic process.  No radiopaque foreign body. Electronically Signed   By: Randa Ngo M.D.   On: 03/21/2022 21:30  ? ?DG Pelvis 1-2 Views ? ?Result Date: 03/21/2022 ?CLINICAL DATA:  Intermittent weakness for 1 week, screening for metal prior to MRI EXAM: PELVIS - 1-2 VIEW COMPARISON:  None. FINDINGS: Supine frontal view of the pelvis demonstrates a 1.2 cm linear metallic surgical clip projecting over the left hemipelvis. Excreted contrast within the ureters and bladder. No other radiopaque foreign bodies. Bowel gas pattern is unremarkable. No acute fracture. IMPRESSION: 1. Surgical clip overlying left hemipelvis. No other radiopaque foreign body. Electronically Signed   By: Randa Ngo M.D.   On: 03/21/2022 21:29  ? ?DG Abd 1 View ? ?Result Date: 03/21/2022 ?CLINICAL DATA:  Intermittent weakness for 1 week, screen for metal prior to MRI EXAM: ABDOMEN - 1 VIEW COMPARISON:  None. FINDINGS: Two supine frontal views of the abdomen and pelvis are obtained. Surgical clip projects over the left hemipelvis. Excreted contrast within the kidneys and bladder. Bowel gas pattern is unremarkable. No acute bony abnormalities. IMPRESSION: 1. Surgical clip within the left hemipelvis. 2. No other radiopaque foreign bodies. Electronically Signed   By: Randa Ngo M.D.   On: 03/21/2022 21:29  ? ?CT HEAD WO CONTRAST (5MM) ? ?Result Date: 03/21/2022 ?CLINICAL DATA:  Slurred speech EXAM: CT HEAD WITHOUT CONTRAST TECHNIQUE:  Contiguous axial images were obtained from the base of the skull through the vertex without intravenous contrast. RADIATION DOSE REDUCTION: This exam was performed according to the departmental dose-optimization program which includes automated exposure control, adjustment of the mA and/or kV according to patient size and/or use of iterative reconstruction technique. COMPARISON:  None. FINDINGS: Brain: There is no mass, hemorrhage or extra-axial collection. There is generalized atrophy without lobar predilection. Hypodensity of the white matter is most commonly associated with chronic microvascular disease. 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 Vascular: No abnormal hyperdensity of the major intracranial arteries or dural  venous sinuses. No intracranial atherosclerosis. Skull: The visualized skull base, calvarium and extracranial soft tissues are normal. Sinuses/Orbits: No fluid levels or advanced mucosal thickening of the visualized paranasal sinuses. No mastoid or middle ear effusion. The orbits are normal. IMPRESSION: 1. No acute intracranial abnormality. 2. 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 Electronically Signed   By: Ulyses Jarred M.D.   On: 03/21/2022 19:27  ? ?MR BRAIN WO CONTRAST ? ?Result Date: 03/21/2022 ?CLINICAL DATA:  Acute word-finding difficulties EXAM: MRI HEAD WITHOUT CONTRAST TECHNIQUE: Multiplanar, multiecho pulse sequences of the brain and surrounding structures were obtained without intravenous contrast. COMPARISON:  None. FINDINGS: Brain: No acute infarct, mass effect or extra-axial collection. No acute or chronic hemorrhage. Hyperintense T2-weighted signal is moderately widespread throughout the white matter. Generalized volume loss without a clear lobar predilection. There are old infarcts of the left frontal and right occipital lobes. The midline structures are normal. Vascular: Major flow voids are preserved. Skull and upper cervical spine: Normal  calvarium and skull base. Visualized upper cervical spine and soft tissues are normal. Sinuses/Orbits:No paranasal sinus fluid levels or advanced mucosal thickening. No mastoid or middle ear effusion. Normal orbi

## 2022-03-21 NOTE — Progress Notes (Signed)
Code stroke activated 1926 ?Neurology on camera 1928 ?Pt left for CT 1938 ?Notified Dr. Rory Percy of CTA results 2013 ?Results acknowledged 2014 ?

## 2022-03-22 ENCOUNTER — Other Ambulatory Visit: Payer: Self-pay

## 2022-03-22 ENCOUNTER — Observation Stay (HOSPITAL_BASED_OUTPATIENT_CLINIC_OR_DEPARTMENT_OTHER)
Admit: 2022-03-22 | Discharge: 2022-03-22 | Disposition: A | Discharge status: Home / Self Care | Payer: Medicare Other | Attending: Obstetrics and Gynecology | Admitting: Obstetrics and Gynecology

## 2022-03-22 ENCOUNTER — Encounter: Payer: Self-pay | Admitting: Internal Medicine

## 2022-03-22 ENCOUNTER — Observation Stay (HOSPITAL_BASED_OUTPATIENT_CLINIC_OR_DEPARTMENT_OTHER)
Admit: 2022-03-22 | Discharge: 2022-03-22 | Disposition: A | Discharge status: Home / Self Care | Payer: Medicare Other | Attending: Physician Assistant | Admitting: Physician Assistant

## 2022-03-22 DIAGNOSIS — R4182 Altered mental status, unspecified: Secondary | ICD-10-CM | POA: Diagnosis not present

## 2022-03-22 DIAGNOSIS — I472 Ventricular tachycardia, unspecified: Secondary | ICD-10-CM | POA: Diagnosis not present

## 2022-03-22 DIAGNOSIS — R030 Elevated blood-pressure reading, without diagnosis of hypertension: Secondary | ICD-10-CM | POA: Diagnosis present

## 2022-03-22 DIAGNOSIS — G459 Transient cerebral ischemic attack, unspecified: Secondary | ICD-10-CM

## 2022-03-22 DIAGNOSIS — Z8543 Personal history of malignant neoplasm of ovary: Secondary | ICD-10-CM

## 2022-03-22 HISTORY — DX: Altered mental status, unspecified: R41.82

## 2022-03-22 LAB — CBC
HCT: 35.3 % — ABNORMAL LOW (ref 36.0–46.0)
Hemoglobin: 12 g/dL (ref 12.0–15.0)
MCH: 32.3 pg (ref 26.0–34.0)
MCHC: 34 g/dL (ref 30.0–36.0)
MCV: 94.9 fL (ref 80.0–100.0)
Platelets: 135 10*3/uL — ABNORMAL LOW (ref 150–400)
RBC: 3.72 MIL/uL — ABNORMAL LOW (ref 3.87–5.11)
RDW: 12.7 % (ref 11.5–15.5)
WBC: 7.6 10*3/uL (ref 4.0–10.5)
nRBC: 0 % (ref 0.0–0.2)

## 2022-03-22 LAB — LIPID PANEL
Cholesterol: 167 mg/dL (ref 0–200)
HDL: 32 mg/dL — ABNORMAL LOW (ref >40)
LDL Cholesterol: 118 mg/dL — ABNORMAL HIGH (ref 0–99)
Total CHOL/HDL Ratio: 5.2 RATIO
Triglycerides: 83 mg/dL (ref <150)
VLDL: 17 mg/dL (ref 0–40)

## 2022-03-22 LAB — COMPREHENSIVE METABOLIC PANEL
ALT: 6 U/L (ref 0–44)
AST: 14 U/L — ABNORMAL LOW (ref 15–41)
Albumin: 3.1 g/dL — ABNORMAL LOW (ref 3.5–5.0)
Alkaline Phosphatase: 66 U/L (ref 38–126)
Anion gap: 7 (ref 5–15)
BUN: 19 mg/dL (ref 8–23)
CO2: 24 mmol/L (ref 22–32)
Calcium: 8.7 mg/dL — ABNORMAL LOW (ref 8.9–10.3)
Chloride: 111 mmol/L (ref 98–111)
Creatinine, Ser: 0.86 mg/dL (ref 0.44–1.00)
GFR, Estimated: >60 mL/min (ref >60)
Glucose, Bld: 95 mg/dL (ref 70–99)
Potassium: 3.8 mmol/L (ref 3.5–5.1)
Sodium: 142 mmol/L (ref 135–145)
Total Bilirubin: 0.5 mg/dL (ref 0.3–1.2)
Total Protein: 6.2 g/dL — ABNORMAL LOW (ref 6.5–8.1)

## 2022-03-22 LAB — ECHOCARDIOGRAM COMPLETE
AR max vel: 2.38 cm2
AV Area VTI: 2.52 cm2
AV Area mean vel: 2.53 cm2
AV Mean grad: 4 mmHg
AV Peak grad: 7.6 mmHg
Ao pk vel: 1.38 m/s
Area-P 1/2: 4.41 cm2
Height: 66 in
MV VTI: 2.01 cm2
S' Lateral: 3.64 cm
Weight: 3679.04 oz

## 2022-03-22 LAB — GLUCOSE, CAPILLARY: Glucose-Capillary: 92 mg/dL (ref 70–99)

## 2022-03-22 LAB — HEMOGLOBIN A1C
Hgb A1c MFr Bld: 4.5 % — ABNORMAL LOW (ref 4.8–5.6)
Mean Plasma Glucose: 82.45 mg/dL

## 2022-03-22 LAB — MAGNESIUM: Magnesium: 2 mg/dL (ref 1.7–2.4)

## 2022-03-22 MED ORDER — CLOPIDOGREL BISULFATE 75 MG PO TABS
75.0000 mg | ORAL_TABLET | Freq: Every day | ORAL | Status: DC
  Administered 2022-03-22: 75 mg via ORAL
  Filled 2022-03-22: qty 1

## 2022-03-22 MED ORDER — SODIUM CHLORIDE 0.9 % IV SOLN: 2.0000 g | INTRAVENOUS | Status: DC

## 2022-03-22 MED ORDER — ATORVASTATIN CALCIUM 80 MG PO TABS: 80.0000 mg | ORAL_TABLET | Freq: Every day | ORAL | Status: DC

## 2022-03-22 MED ORDER — SODIUM CHLORIDE 0.9% FLUSH
3.0000 mL | Freq: Two times a day (BID) | INTRAVENOUS | Status: DC
  Administered 2022-03-22 (×2): 3 mL via INTRAVENOUS

## 2022-03-22 MED ORDER — HEPARIN SODIUM (PORCINE) 5000 UNIT/ML IJ SOLN
5000.0000 [IU] | Freq: Three times a day (TID) | INTRAMUSCULAR | Status: DC
  Filled 2022-03-22 (×2): qty 1

## 2022-03-22 MED ORDER — HYDRALAZINE HCL 20 MG/ML IJ SOLN: 10.0000 mg | Freq: Four times a day (QID) | INTRAMUSCULAR | Status: DC | PRN

## 2022-03-22 MED ORDER — ACETAMINOPHEN 325 MG PO TABS: 650.0000 mg | ORAL_TABLET | Freq: Four times a day (QID) | ORAL | Status: DC | PRN

## 2022-03-22 MED ORDER — SODIUM CHLORIDE 0.9 % IV SOLN: 20.0000 mL/h | INTRAVENOUS | Status: DC

## 2022-03-22 MED ORDER — ATENOLOL 25 MG PO TABS
25.0000 mg | ORAL_TABLET | Freq: Every day | ORAL | Status: DC
Start: 2022-03-23 — End: 2022-03-23

## 2022-03-22 MED ORDER — ACETAMINOPHEN 650 MG RE SUPP: 650.0000 mg | Freq: Four times a day (QID) | RECTAL | Status: DC | PRN

## 2022-03-22 MED ORDER — ATENOLOL 50 MG PO TABS
50.0000 mg | ORAL_TABLET | Freq: Every day | ORAL | Status: DC
Start: 2022-03-22 — End: 2022-03-22
  Filled 2022-03-22: qty 1

## 2022-03-22 MED ORDER — ATENOLOL 50 MG PO TABS: 25.0000 mg | ORAL_TABLET | Freq: Every day | ORAL | Status: DC

## 2022-03-22 MED ORDER — NITROFURANTOIN MONOHYD MACRO 100 MG PO CAPS: 100.0000 mg | ORAL_CAPSULE | Freq: Two times a day (BID) | ORAL | 0 refills | Status: DC

## 2022-03-22 MED ORDER — SODIUM CHLORIDE 0.9 % IV SOLN
1.0000 g | INTRAVENOUS | Status: DC
  Filled 2022-03-22: qty 10

## 2022-03-22 MED ORDER — ASPIRIN 81 MG PO CHEW
81.0000 mg | CHEWABLE_TABLET | Freq: Every day | ORAL | Status: DC
  Administered 2022-03-22: 81 mg via ORAL
  Filled 2022-03-22: qty 1

## 2022-03-22 MED ORDER — ATORVASTATIN CALCIUM 80 MG PO TABS: 80.0000 mg | ORAL_TABLET | Freq: Every day | ORAL | 1 refills | Status: DC

## 2022-03-22 MED ORDER — CLOPIDOGREL BISULFATE 75 MG PO TABS: 75.0000 mg | ORAL_TABLET | Freq: Every day | ORAL | 0 refills | Status: DC

## 2022-03-22 MED ORDER — CLONAZEPAM 0.25 MG PO TBDP: 0.2500 mg | ORAL_TABLET | Freq: Two times a day (BID) | ORAL | Status: DC | PRN

## 2022-03-22 NOTE — Progress Notes (Signed)
ROS per bedside ?

## 2022-03-22 NOTE — Plan of Care (Signed)
?  Problem: Education: ?Goal: Knowledge of General Education information will improve ?Description: Including pain rating scale, medication(s)/side effects and non-pharmacologic comfort measures ?Outcome: Progressing ?  ?Problem: Clinical Measurements: ?Goal: Respiratory complications will improve ?Outcome: Progressing ?  ?Problem: Elimination: ?Goal: Will not experience complications related to urinary retention ?Outcome: Progressing ?  ?Problem: Pain Managment: ?Goal: General experience of comfort will improve ?Outcome: Progressing ?  ?Problem: Safety: ?Goal: Ability to remain free from injury will improve ?Outcome: Progressing ?  ?Problem: Skin Integrity: ?Goal: Risk for impaired skin integrity will decrease ?Outcome: Progressing ?  ?

## 2022-03-22 NOTE — Discharge Summary (Addendum)
Sydney Vaughan DOB: Mar 12, 1941 DOA: 03/21/2022 ? ?PCP: System, Provider Not In ? ?Admit date: 03/21/2022 ?Discharge date: 03/22/2022 ? ?Time spent: 45 minutes ? ?Recommendations for Outpatient Follow-up:  ?Pcp f/u  ?Cardiology f/u 1 month ?Neurology f/u  ?Needs thyroid ultrasound ? ? ? ?Discharge Diagnoses:  ?Principal Problem: ?  AMS (altered mental status) ?Active Problems: ?  Tachycardia ?  Obesity ?  Elevated blood pressure reading ?  Personal history of ovarian cancer ?  TIA (transient ischemic attack) ? ? ?Discharge Condition: stable ? ?Diet recommendation: heart healthy ? ?Filed Weights  ? 03/21/22 1904 03/22/22 0256  ?Weight: 104 kg 104.3 kg  ? ? ?History of present illness:  ?From admission h and p: ?Sydney Vaughan is a 81 y.o. female with medical history significant of obesity, history of tobacco abuse, history of tachycardia on atenolol, ovarian cancer status post hysterectomy and bilateral mastectomy, depression presenting with altered mental status. ?Patient states that she was doing something in the home and was not able to speak and could not get her words out and per report this is about 45 minutes which resolved.  This has happened in the past once before.  Patient currently does not report any headaches blurred vision speech or gait issues fevers chills chest pain palpitations.  Review of systems is otherwise negative.  Upon arrival to the emergency room a code stroke was called on patient had neurological imaging with negative findings on CT and MRI. ? ?Hospital Course:  ?Patient admitted with what appears to be TIA. MRI without acute findings but does show chronic small vessel ischemic disease as well as evidence of old infarcts of the left frontal and right occipital lobes. Tele monitoring normal. Seen by neurology, advised continue aspirin, add plavix for 3 months, and start high dose statin, which were all prescribe. TTE without thrombus. Ziopatch was placed and patient will f/u with  cardiology for results. Patient also reports several weeks dysuria. Urinalysis suggestive of infection. Patient was started on ceftriaxone and will be discharged with nitrofurantoin. She ambulated without difficulty prior to discharge. Lastly patient with thyroid abnormality on CT, needs dedicated thyroid ultrasound as outpatient. For bradycardia, asymptomatic, we advised decreasing her home atenolol dose. I carefully reviewed all these instructions with her son. ? ?Procedures: ?none  ? ?Consultations: ?neurology ? ?Discharge Exam: ?Vitals:  ? 03/22/22 1126 03/22/22 1500  ?BP: (!) 120/57 (!) 126/59  ?Pulse: (!) 53 (!) 56  ?Resp: 19 18  ?Temp: 97.9 ?F (36.6 ?C) 98.1 ?F (36.7 ?C)  ?SpO2: 97% 98%  ? ? ?General: NAD ?Cardiovascular: RRR ?Respiratory: CTAB ?Neuro: cn2-12 grossly intact, normal gait, 5/5 upper and lower strength ? ?Discharge Instructions ? ? ?Discharge Instructions   ? ? Diet - low sodium heart healthy   Complete by: As directed ?  ? Increase activity slowly   Complete by: As directed ?  ? ?  ? ?Allergies as of 03/22/2022   ?No Known Allergies ?  ? ?  ?Medication List  ?  ? ?TAKE these medications   ? ?aspirin 81 MG chewable tablet ?Chew by mouth daily. ?  ?atenolol 50 MG tablet ?Commonly known as: TENORMIN ?Take 0.5 tablets (25 mg total) by mouth daily. ?What changed: how much to take ?  ?atorvastatin 80 MG tablet ?Commonly known as: LIPITOR ?Take 1 tablet (80 mg total) by mouth daily. ?Start taking on: Mar 23, 2022 ?  ?clonazePAM 0.5 MG tablet ?Commonly known as: KLONOPIN ?Take 0.25 mg by mouth as needed  for anxiety. ?  ?clopidogrel 75 MG tablet ?Commonly known as: PLAVIX ?Take 1 tablet (75 mg total) by mouth daily. ?Start taking on: Mar 23, 2022 ?  ?nitrofurantoin (macrocrystal-monohydrate) 100 MG capsule ?Commonly known as: MACROBID ?Take 1 capsule (100 mg total) by mouth 2 (two) times daily. ?  ? ?  ? ?No Known Allergies ? Follow-up Information   ? ? Rise Mu, PA-C Follow up.   ?Specialties:  Physician Assistant, Cardiology, Radiology ?Why: call to schedule when to come in to review results of your heart montior ?Contact information: ?[address] ?[address] ?[place] [address] ?[phone] ? ? ?  ?  ? ? Anabel Bene, MD Follow up.   ?Specialty: Neurology ?Why: this is a local neurologist you can follow-up with ?Contact information: ?[address] ?[facility] ?[place] [address] ?[phone] ? ? ?  ?  ? ? Your PCP Follow up.   ? ?  ?  ? ?  ?  ? ?  ? ? ? ?The results of significant diagnostics from this hospitalization (including imaging, microbiology, ancillary and laboratory) are listed below for reference.   ? ?Significant Diagnostic Studies: ?DG Eye Foreign Body ? ?Result Date: 03/21/2022 ?CLINICAL DATA:  Screening for metal prior to MRI, intermittent weakness for 1 week EXAM: ORBITS FOR FOREIGN BODY - 2 VIEW COMPARISON:  None. FINDINGS: There is no evidence of metallic foreign body within the orbits. No significant bone abnormality identified. Dental amalgam is noted. IMPRESSION: No evidence of metallic foreign body within the orbits. Electronically Signed   By: Randa Ngo M.D.   On: 03/21/2022 21:31  ? ?DG Chest 1 View ? ?Result Date: 03/21/2022 ?CLINICAL DATA:  Screening for metal prior to MRI, intermittent weakness for 1 week EXAM: CHEST  1 VIEW COMPARISON:  None. FINDINGS: Single frontal view of the chest demonstrates multiple cardiac leads overlying the chest. Cardiac silhouette is unremarkable. No airspace disease, effusion, or pneumothorax. No radiopaque foreign body. No acute fracture. IMPRESSION: 1. No acute intrathoracic process.  No radiopaque foreign body. Electronically Signed   By: Randa Ngo M.D.   On: 03/21/2022 21:30  ? ?DG Pelvis 1-2 Views ? ?Result Date: 03/21/2022 ?CLINICAL DATA:  Intermittent weakness for 1 week, screening for metal prior to MRI EXAM: PELVIS - 1-2 VIEW COMPARISON:  None. FINDINGS: Supine frontal view of the pelvis  demonstrates a 1.2 cm linear metallic surgical clip projecting over the left hemipelvis. Excreted contrast within the ureters and bladder. No other radiopaque foreign bodies. Bowel gas pattern is unremarkable. No acute fracture. IMPRESSION: 1. Surgical clip overlying left hemipelvis. No other radiopaque foreign body. Electronically Signed   By: Randa Ngo M.D.   On: 03/21/2022 21:29  ? ?DG Abd 1 View ? ?Result Date: 03/21/2022 ?CLINICAL DATA:  Intermittent weakness for 1 week, screen for metal prior to MRI EXAM: ABDOMEN - 1 VIEW COMPARISON:  None. FINDINGS: Two supine frontal views of the abdomen and pelvis are obtained. Surgical clip projects over the left hemipelvis. Excreted contrast within the kidneys and bladder. Bowel gas pattern is unremarkable. No acute bony abnormalities. IMPRESSION: 1. Surgical clip within the left hemipelvis. 2. No other radiopaque foreign bodies. Electronically Signed   By: Randa Ngo M.D.   On: 03/21/2022 21:29  ? ?CT HEAD WO CONTRAST (5MM) ? ?Result Date: 03/21/2022 ?CLINICAL DATA:  Slurred speech EXAM: CT HEAD WITHOUT CONTRAST TECHNIQUE: Contiguous axial images were obtained from the base of the skull through the vertex without  intravenous contrast. RADIATION DOSE REDUCTION: This exam was performed according to the departmental dose-optimization program which includes automated exposure control, adjustment of the mA and/or kV according to patient size and/or use of iterative reconstruction technique. COMPARISON:  None. FINDINGS: Brain: There is no mass, hemorrhage or extra-axial collection. There is generalized atrophy without lobar predilection. Hypodensity of the white matter is most commonly associated with chronic microvascular disease. 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 Vascular: No abnormal hyperdensity of the major intracranial arteries or dural venous sinuses. No intracranial atherosclerosis. Skull: The visualized skull base, calvarium and extracranial soft tissues are  normal. Sinuses/Orbits: No fluid levels or advanced mucosal thickening of the visualized paranasal sinuses. No mastoid or middle ear effusion. The orbits are normal. IMPRESSION: 1. No acute intracranial abnormality.

## 2022-03-22 NOTE — Assessment & Plan Note (Signed)
Pt is 104 kg and is obese. ?Diet counseling prior to discharge.  ?

## 2022-03-22 NOTE — Assessment & Plan Note (Signed)
Blood pressure (!) 141/68, pulse (!) 49, temperature 98.1 ?F (36.7 ?C), resp. rate 16, weight 104 kg, SpO2 96 %. ?Patient has been on atenolol for her tachycardia. ?Suspect patient has underlying sleep apnea which may be causing tachycardia and possibly even paroxysmal atrial fibrillation.  We will obtain an echocardiogram. ? ?

## 2022-03-22 NOTE — Progress Notes (Signed)
Request for Zio patch for TIA. Order placed. Respiratory therapy to apply. Please do not discharge the patient until cardiac monitor has been applied. Epic chat sent to IM and RN.  ?

## 2022-03-22 NOTE — Progress Notes (Addendum)
Neurology Progress Note ? ? ?S:// ?Seen and examined ?MRI negative for acute stroke ?CT angiogram with severe stenosis versus occlusion of the superior left M2 division shortly after bifurcation which seems to be chronic as it corresponds to the infarct territory seen on noncontrast head CT. ? ? ?O:// ?Current vital signs: ?BP (!) 124/59 (BP Location: Right Arm)   Pulse (!) 54   Temp 98 ?F (36.7 ?C) (Oral)   Resp 19   Ht '5\' 6"'$  (1.676 m)   Wt 104.3 kg   SpO2 100%   BMI 37.11 kg/m?  ?Vital signs in last 24 hours: ?Temp:  [97.7 ?F (36.5 ?C)-98.1 ?F (36.7 ?C)] 98 ?F (36.7 ?C) (05/02 4944) ?Pulse Rate:  [43-68] 54 (05/02 0727) ?Resp:  [12-27] 19 (05/02 0727) ?BP: (124-175)/(43-100) 124/59 (05/02 0727) ?SpO2:  [96 %-100 %] 100 % (05/02 0727) ?Weight:  [104 kg-104.3 kg] 104.3 kg (05/02 0256) ?General: Awake alert in no distress ?Neurological exam ?Awake alert oriented x3 ?Speech is not dysarthric ?No evidence of aphasia ?Cranial nerves II to XII intact ?Motor examination with symmetric drift in bilateral lower extremities, no drift in the upper extremities although there is some restricted motion in the right shoulder which is chronic. ?Sensation intact today ?No gross dysmetria ?   ?NIHSS ?1A: Level of Consciousness - 0 ?1B: Ask Month and Age - 0 ?1C: 'Blink Eyes' & 'Squeeze Hands' - 0 ?2: Test Horizontal Extraocular Movements - 0 ?3: Test Visual Fields - 0 ?4: Test Facial Palsy - 0 ?5A: Test Left Arm Motor Drift - 0 ?5B: Test Right Arm Motor Drift - 0 ?6A: Test Left Leg Motor Drift - 1 ?6B: Test Right Leg Motor Drift - 1 ?7: Test Limb Ataxia - 0 ?8: Test Sensation - 0 ?9: Test Language/Aphasia- 0 ?10: Test Dysarthria - 0 ?11: Test Extinction/Inattention - 0 ?NIHSS score: 2 ? ?Medications ? ?Current Facility-Administered Medications:  ?  0.9 %  sodium chloride infusion, 20 mL/hr, Intravenous, Continuous, Para Skeans, MD, Paused at 03/22/22 0049 ?  acetaminophen (TYLENOL) tablet 650 mg, 650 mg, Oral, Q6H PRN  **OR** acetaminophen (TYLENOL) suppository 650 mg, 650 mg, Rectal, Q6H PRN, Para Skeans, MD ?  aspirin chewable tablet 81 mg, 81 mg, Oral, Daily, Wouk, Ailene Rud, MD, 81 mg at 03/22/22 9675 ?  atenolol (TENORMIN) tablet 50 mg, 50 mg, Oral, Daily, Wouk, Ailene Rud, MD ?  cefTRIAXone (ROCEPHIN) 1 g in sodium chloride 0.9 % 100 mL IVPB, 1 g, Intravenous, Q24H, Wouk, Ailene Rud, MD ?  clonazePAM (KLONOPIN) disintegrating tablet 0.25 mg, 0.25 mg, Oral, BID PRN, Wouk, Ailene Rud, MD ?  heparin injection 5,000 Units, 5,000 Units, Subcutaneous, Q8H, Florina Ou V, MD ?  hydrALAZINE (APRESOLINE) injection 10 mg, 10 mg, Intravenous, Q6H PRN, Para Skeans, MD ?  sodium chloride flush (NS) 0.9 % injection 3 mL, 3 mL, Intravenous, Q12H, Para Skeans, MD, 3 mL at 03/22/22 0925 ? ?Labs ?CBC ?   ?Component Value Date/Time  ? WBC 7.6 03/22/2022 0648  ? RBC 3.72 (L) 03/22/2022 9163  ? HGB 12.0 03/22/2022 0648  ? HGB 8.9 (L) 07/13/2013 1209  ? HCT 35.3 (L) 03/22/2022 8466  ? HCT 25.2 (L) 07/13/2013 1209  ? PLT 135 (L) 03/22/2022 5993  ? PLT 49 (L) 07/13/2013 1209  ? MCV 94.9 03/22/2022 0648  ? MCV 92 07/13/2013 1209  ? MCH 32.3 03/22/2022 0648  ? MCHC 34.0 03/22/2022 0648  ? RDW 12.7 03/22/2022 0648  ? RDW 18.9 (  H) 07/13/2013 1209  ? LYMPHSABS 1.8 03/21/2022 1849  ? LYMPHSABS 1.2 07/13/2013 1209  ? MONOABS 1.1 (H) 03/21/2022 1849  ? MONOABS 0.6 07/13/2013 1209  ? EOSABS 0.2 03/21/2022 1849  ? EOSABS 0.0 07/13/2013 1209  ? BASOSABS 0.0 03/21/2022 1849  ? BASOSABS 0.0 07/13/2013 1209  ? ? ?CMP  ?   ?Component Value Date/Time  ? NA 142 03/22/2022 0648  ? K 3.8 03/22/2022 0648  ? CL 111 03/22/2022 0648  ? CO2 24 03/22/2022 0648  ? GLUCOSE 95 03/22/2022 0648  ? BUN 19 03/22/2022 0648  ? CREATININE 0.86 03/22/2022 0648  ? CALCIUM 8.7 (L) 03/22/2022 8938  ? PROT 6.2 (L) 03/22/2022 1017  ? ALBUMIN 3.1 (L) 03/22/2022 5102  ? AST 14 (L) 03/22/2022 5852  ? ALT 6 03/22/2022 0648  ? ALKPHOS 66 03/22/2022 0648  ? BILITOT 0.5 03/22/2022  0648  ? GFRNONAA >60 03/22/2022 0648  ? ? ?Imaging ?I have reviewed images in epic and the results pertinent to this consultation are: ? ?CT-scan of the brain ?IMPRESSION: ?1. No acute intracranial abnormality. ?2. 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 ? ? ?CT angio head and neck ?IMPRESSION: ?1. Occlusion of a superior left M2 division shortly after the ?bifurcation, likely chronic as this corresponds to the infarct ?territory seen on the noncontrast head CT. ?2. Otherwise, patent vasculature of the head and neck with no ?hemodynamically significant stenosis or occlusion. ?3. Heterogeneous thyroid with an approximately 2.7 cm nodule on the ?right. Recommend nonemergent thyroid ultrasound for further ?evaluation. ?4. A few small nodules in the right upper lobe measuring up to 3-4 ?mm are nonspecific and may be infectious or inflammatory in nature. ?If the patient is at high risk for malignancy, noncontrast chest CT ?in 1 year may be considered. ? ?MRI examination of the brain ?IMPRESSION: ?1. No acute intracranial abnormality. ?2. 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 ?3. 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 ? ?Assessment:  ?81 year old woman presented to the emergency room for multiple episodes for the last 1 week of slurred speech and weakness and was evaluated by me via telemedicine yesterday.  Not a candidate for IV or EVT interventions. ?She continues to have symmetric lower extremity weakness without any focality today. ?Her symptoms could reflect TIAs from the severely stenosed/chronically occluded left M2 vessel. 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 ?She does not like to see doctors. ?Current presentation could also be secondary to UTI predisposing her to have more of the symptoms from the affected blood vessel. ?Current episode/episodes  could reflect multiple TIAs. ?Due to cortical damage from prior strokes, seizures should also be considered as an etiology and should get work-up as an outpatient ? ?Impression: ?Evaluate for TIA ?Evaluate for UTI ?Outpatient evaluation for seizures ? ?Recommendations: ?Asa + plavix 3 months  followed by [facility]SA only ?Atorvastatin 80 ?Continue telemetry ?Frequent neurochecks ?One of the strokes does match the stenosed vessel but she has strokes in other territories as well.  I would recommend outpatient cardiac monitoring to look for any evidence of underlying atrial fibrillation. ?2D echo pending ?A1c pending-goal less than 7 ?LDL pending-goal less than 70. ?Treatment of UTI per primary team. ?Follow-up with outpatient neurology in 4 to 6 weeks.  Consider outpatient EEG. ?Answered the questions that she had for me.  Discussed the plan with Dr. Si Raider ? ?-- ?  Amie Portland, MD ?Neurologist ?[facility] ?Pager: [phone] ?

## 2022-03-22 NOTE — Progress Notes (Signed)
Discussed discharge instructions with patient including medications and follow up appointments.   Encourage patient to take all her prescribed medications including Lipitor. ? ?Gave medication information handout on Lipitor.     ?

## 2022-03-22 NOTE — Progress Notes (Signed)
Nutrition Brief Note ? ?RD consulted for assessment of nutritional needs and diet education related to obesity.  ? ?Wt Readings from Last 15 Encounters:  ?03/22/22 104.3 kg  ? ?Sydney Vaughan is a 81 y.o. female with medical history significant of obesity, history of tobacco abuse, history of tachycardia on atenolol, ovarian cancer status post hysterectomy and bilateral mastectomy, depression presenting with altered mental status.  Patient states that she was doing something in the home and was not able to speak and could not get her words out and per report this is about 45 minutes which resolved.  This has happened in the past once before.  Patient currently does not report any headaches blurred vision speech or gait issues fevers chills chest pain palpitations.  Review of systems is otherwise negative.  Upon arrival to the emergency room a code stroke was called on patient had neurological imaging with negative findings on CT and MRI. ? ?Pt admitted with AMS and possible UTI. ? ?Reviewed I/O's: +40 ml x 24 hours ? ?UOP: 300 ml x 24 hours ? ?Spoke with pt at bedside, who reports she is anxious and wants to go home. Interview was stopped prematurely due to pt personal phone call.  ? ?Nutrition-Focused physical exam completed. Findings are no fat depletion, no muscle depletion, and mild edema.   ? ?RD will liberalize diet for wider variety of meal selections.  ? ?No results found for: HGBA1C PTA DM medications are none.  ? ?Labs reviewed: CBGS: 92 (inpatient orders for glycemic control are none).   ? ?Body mass index is 37.11 kg/m?Marland Kitchen Patient meets criteria for obesity, class II based on current BMI. Obesity is a complex, chronic medical condition that is optimally managed by a multidisciplinary care team. Weight loss is not an ideal goal for an acute inpatient hospitalization. However, if further work-up for obesity is warranted, consider outpatient referral to outpatient bariatric service and/or [facility]  N[facility] Diabetes Education [facility].   ? ?Current diet order is heart healthy/ carb modified, patient is consuming approximately 100% of meals at this time. Labs and medications reviewed.  ? ?No nutrition interventions warranted at this time. If nutrition issues arise, please consult RD.  ? ?Loistine Chance, RD, LDN, CDCES ?Registered Dietitian II ?Certified Diabetes Care and Education Specialist ?Please refer to [facility] for RD and/or RD on-call/weekend/after hours pager   ?

## 2022-03-22 NOTE — TOC CM/SW Note (Signed)
Per MD, patient will likely discharge home today. Patient said she and her son's car is in the shop and no one is willing to help him come pick her up. Address verified. Cab voucher put on front of the chart for RN to call when she is ready. ? ?[place]rape, [place] ?[phone] ? ?

## 2022-03-22 NOTE — Assessment & Plan Note (Signed)
Differentials include TIA, underlying dysrhythmia. ?Neurochecks and admit to monitored unit with continuous cardiac monitoring and pulse oximetry. ?We will obtain electrolytes and thyroid function tests. ? ?

## 2022-03-22 NOTE — Assessment & Plan Note (Signed)
Cont with prn hydralazine.  ? ?

## 2022-03-22 NOTE — ED Notes (Signed)
Transport requested

## 2022-03-24 LAB — URINE CULTURE: Culture: >=100000 — AB

## 2022-04-04 ENCOUNTER — Telehealth: Payer: Self-pay | Admitting: Cardiology

## 2022-04-04 NOTE — Telephone Encounter (Signed)
Spoke with patient and reviewed that we need to move her appointment to this Thursday at 4:20 pm here in our office. She reports having a hectic week scheduled but advised we need to see her and stressed the importance of this visit. She was agreeable with this and will make sure to be here. She confirmed date, time, and location with no further questions at this time.  ?

## 2022-04-07 ENCOUNTER — Encounter: Payer: Self-pay | Admitting: Cardiology

## 2022-04-07 ENCOUNTER — Ambulatory Visit (INDEPENDENT_AMBULATORY_CARE_PROVIDER_SITE_OTHER): Payer: Medicare Other | Admitting: Cardiology

## 2022-04-07 VITALS — BP 149/96 | HR 115 | Ht 66.0 in | Wt 237.5 lb

## 2022-04-07 DIAGNOSIS — R000 Tachycardia, unspecified: Secondary | ICD-10-CM | POA: Diagnosis not present

## 2022-04-07 DIAGNOSIS — Z87898 Personal history of other specified conditions: Secondary | ICD-10-CM

## 2022-04-07 DIAGNOSIS — M7989 Other specified soft tissue disorders: Secondary | ICD-10-CM

## 2022-04-07 DIAGNOSIS — G459 Transient cerebral ischemic attack, unspecified: Secondary | ICD-10-CM | POA: Diagnosis not present

## 2022-04-07 DIAGNOSIS — Z87891 Personal history of nicotine dependence: Secondary | ICD-10-CM

## 2022-04-07 DIAGNOSIS — I480 Paroxysmal atrial fibrillation: Secondary | ICD-10-CM

## 2022-04-07 DIAGNOSIS — R030 Elevated blood-pressure reading, without diagnosis of hypertension: Secondary | ICD-10-CM

## 2022-04-07 DIAGNOSIS — F411 Generalized anxiety disorder: Secondary | ICD-10-CM

## 2022-04-07 DIAGNOSIS — I4891 Unspecified atrial fibrillation: Secondary | ICD-10-CM | POA: Diagnosis not present

## 2022-04-07 DIAGNOSIS — E785 Hyperlipidemia, unspecified: Secondary | ICD-10-CM | POA: Diagnosis not present

## 2022-04-07 DIAGNOSIS — R600 Localized edema: Secondary | ICD-10-CM

## 2022-04-07 DIAGNOSIS — Z8673 Personal history of transient ischemic attack (TIA), and cerebral infarction without residual deficits: Secondary | ICD-10-CM

## 2022-04-07 MED ORDER — APIXABAN 5 MG PO TABS: 5.0000 mg | ORAL_TABLET | Freq: Two times a day (BID) | ORAL | 6 refills | Status: DC

## 2022-04-07 NOTE — Patient Instructions (Addendum)
Medication Instructions:  - Your physician has recommended you make the following change in your medication:   1) CHANGE Atenolol 50 mg: - take 0.5 tablet (25 mg) by mouth TWICE daily   2) STOP Aspirin  3) STOP Plavix  4) START Eliquis 5 mg: - take 1 tablet by mouth twice daily    *If you need a refill on your cardiac medications before your next appointment, please call your pharmacy*   Lab Work: - none ordered  If you have labs (blood work) drawn today and your tests are completely normal, you will receive your results only by: [place] (if you have MyChart) OR A paper copy in the mail If you have any lab test that is abnormal or we need to change your treatment, we will call you to review the results.   Testing/Procedures:  1) Echocardiogram: - Your physician has requested that you have an echocardiogram. Echocardiography is a painless test that uses sound waves to create images of your heart. It provides your doctor with information about the size and shape of your heart and how well your heart's chambers and valves are working. This procedure takes approximately one hour. There are no restrictions for this procedure. There is a possibility that an IV may need to be started during your test to inject an image enhancing agent. This is done to obtain more optimal pictures of your heart. Therefore we ask that you do at least drink some water prior to coming in to hydrate your veins.    2) Lower Extremity Venous Ultrasound: - Your physician has requested that you have a lower or upper extremity venous duplex. This test is an ultrasound of the veins in the legs or arms. It looks at venous blood flow that carries blood from the heart to the legs or arms. Allow one hour for a Lower Venous exam. Allow thirty minutes for an Upper Venous exam. There are no restrictions or special instructions.     Follow-Up: At [facility], you and your health needs are our priority.  As  part of our continuing mission to provide you with exceptional heart care, we have created designated Provider Care Teams.  These Care Teams include your primary Cardiologist (physician) and Advanced Practice Providers (APPs -  Physician Assistants and Nurse Practitioners) who all work together to provide you with the care you need, when you need it.  We recommend signing up for the patient portal called "MyChart".  Sign up information is provided on this After Visit Summary.  MyChart is used to connect with patients for Virtual Visits (Telemedicine).  Patients are able to view lab/test results, encounter notes, upcoming appointments, etc.  Non-urgent messages can be sent to your provider as well.   To learn more about what you can do with MyChart, go to [email or web address].    Your next appointment:   2 month(s)  The format for your next appointment:   In Person  Provider:   Glenetta Hew, MD    Other Instructions  Echocardiogram An echocardiogram is a test that uses sound waves (ultrasound) to produce images of the heart. Images from an echocardiogram can provide important information about: Heart size and shape. The size and thickness and movement of your heart's walls. Heart muscle function and strength. Heart valve function or if you have stenosis. Stenosis is when the heart valves are too narrow. If blood is flowing backward through the heart valves (regurgitation). A tumor or infectious growth around the heart valves.  Areas of heart muscle that are not working well because of poor blood flow or injury from a heart attack. Aneurysm detection. An aneurysm is a weak or damaged part of an artery wall. The wall bulges out from the normal force of blood pumping through the body. Tell a health care provider about: Any allergies you have. All medicines you are taking, including vitamins, herbs, eye drops, creams, and over-the-counter medicines. Any blood disorders you have. Any  surgeries you have had. Any medical conditions you have. Whether you are pregnant or may be pregnant. What are the risks? Generally, this is a safe test. However, problems may occur, including an allergic reaction to dye (contrast) that may be used during the test. What happens before the test? No specific preparation is needed. You may eat and drink normally. What happens during the test?  You will take off your clothes from the waist up and put on a hospital gown. Electrodes or electrocardiogram (ECG)patches may be placed on your chest. The electrodes or patches are then connected to a device that monitors your heart rate and rhythm. You will lie down on a table for an ultrasound exam. A gel will be applied to your chest to help sound waves pass through your skin. A handheld device, called a transducer, will be pressed against your chest and moved over your heart. The transducer produces sound waves that travel to your heart and bounce back (or "echo" back) to the transducer. These sound waves will be captured in real-time and changed into images of your heart that can be viewed on a video monitor. The images will be recorded on a computer and reviewed by your health care provider. You may be asked to change positions or hold your breath for a short time. This makes it easier to get different views or better views of your heart. In some cases, you may receive contrast through an IV in one of your veins. This can improve the quality of the pictures from your heart. The procedure may vary among health care providers and hospitals. What can I expect after the test? You may return to your normal, everyday life, including diet, activities, and medicines, unless your health care provider tells you not to do that. Follow these instructions at home: It is up to you to get the results of your test. Ask your health care provider, or the department that is doing the test, when your results will be  ready. Keep all follow-up visits. This is important. Summary An echocardiogram is a test that uses sound waves (ultrasound) to produce images of the heart. Images from an echocardiogram can provide important information about the size and shape of your heart, heart muscle function, heart valve function, and other possible heart problems. You do not need to do anything to prepare before this test. You may eat and drink normally. After the echocardiogram is completed, you may return to your normal, everyday life, unless your health care provider tells you not to do that. This information is not intended to replace advice given to you by your health care provider. Make sure you discuss any questions you have with your health care provider. Document Revised: 07/21/2021 Document Reviewed: 06/30/2020 Elsevier Patient Education  [place]

## 2022-04-07 NOTE — Progress Notes (Signed)
Primary Care Provider: System, Provider Not In Danae Orleans, MD   [place], [place] [address]   Phone: [phone]   Fax: [phone]  [facility] Cardiologist: None Electrophysiologist: None  Clinic Note: Chief Complaint  Patient presents with   New Patient (Initial Visit)    Afib c/o fatigue and denies chest pain/irregular heart rhythm. Pt confused as to why she is here and why this appointment is necessary. Meds reviewed verbally with pt.   Atrial Fibrillation   ===================================  ASSESSMENT/PLAN   Problem List Items Addressed This Visit       Cardiology Problems   Atrial fibrillation with rapid ventricular response (HCC) - Primary    New diagnosis of A-fib RVR, 46%, so it is not distal foot.  This clearly was probably the etiology for her TIA.  Thankfully it was only TIA.  She does have a history of prior infarct.  Since she is not really having any symptoms whatsoever being in A-fib, I do not see a reason for treating with antiarrhythmics.  Based on the burden I do not think is worth it to cardiovert unless she is having symptoms.  Plan: Continue with Tenormin but will increase back to 50 mg daily.  For now I am okay with her using as needed Klonopin for tachycardia spells. Start Eliquis 5 mg twice daily Check 2D echo Since she is already on 115 beats a minute with no real dyspnea or chest comfort, I do not think we necessarily need to do an ischemic evaluation.  (Essentially passing her stress test)   This patients CHA2DS2-VASc Score and unadjusted Ischemic Stroke Rate (% per year) is equal to 11.2 % stroke rate/year from a score of 7  Above score calculated as 1 point each if present [CHF, HTN, DM, Vascular=MI/PAD/Aortic Plaque, Age if 65-74, or Female] Above score calculated as 2 points each if present [Age > 75, or Stroke/TIA/TE]      Relevant Medications   apixaban (ELIQUIS) 5 MG TABS tablet   atenolol (TENORMIN) 50 MG  tablet   Hyperlipidemia with target LDL less than 70 (Chronic)    Was supposedly started on high-dose statin in the hospital.  She does not want take it.  Stating that she is scared of side effects.  I explained to her that pros and cons of taking or not taking.  She is very concerned about side effects.  She has read a lot about this and is concerned about the atorvastatin.  Plan: For now we will hold off on any lipid-lowering agent, but in the future may want to consider rosuvastatin       Relevant Medications   apixaban (ELIQUIS) 5 MG TABS tablet   atenolol (TENORMIN) 50 MG tablet   TIA (transient ischemic attack) (Chronic)    With recent TIA and history of prior stroke, it is quite possible that she has had A-fib for longer than we know now.  Monitor did show A-fib at least 47%.  She does not feel being in A-fib.  Just the concern.  Thankfully no residual symptoms from her stroke.  Plan:  With evidence of A-fib, recommend stopping aspirin Plavix and starting Eliquis 5 mg twice daily. 2D Echo Pending       Relevant Medications   apixaban (ELIQUIS) 5 MG TABS tablet   atenolol (TENORMIN) 50 MG tablet     Other   Anxiety state    I truthfully think she would probably do better on  long-term therapy but does not want to take many other medications.  She is happy with Klonopin which treats her symptoms.  She does seem to be under a lot of stress very tearful with me today.  The fact that her daughter is very sick and dying from cancer is very distressing to her.  She is also trying to move her house in the midst of all of the other activities.  I think she would probably benefit from an SSRI, but would defer to primary.       History of tachycardia    She has had fast heart rate spells in the past.  She is always been on relatively high dose of Tenormin.  She previously was taking 50 mg daily, reduced to 25 mg because of bradycardia in the hospital.  In the past she would have these  spells of fast heart rates that she attributed to anxiety.  She usually treats them with Klonopin.Marland Kitchen  Klonopin plus resting usually it is 0 a.m. she did not have any other untoward symptoms.       History of tobacco abuse    She needs to quit, but currently using it as a crutch.  We can discuss in follow-up.       Tachycardia   Relevant Orders   EKG 12-Lead   Elevated blood pressure reading    Pressure is high today despite having tachycardia at 115 bpm.  She is on relatively high dose of Tenormin that was recently decreased.  We will increase back to 50 mg daily.       History of cardioembolic stroke    Now with evidence of A-fib, I think we are okay stopping aspirin Plavix and switching to Eliquis.  Spent a lot of time talking about her CHA2DS2-VASc score versus has bled score.  I also indicated that Eliquis is probably has a lesser risk profile then either aspirin or Plavix.       Left leg swelling    Painful swelling of left leg.  Concerning for possible DVT.  Even though it was starting her on Eliquis, would like to exclude DVT.  Also likely few venous stasis.  Plan: Low extremity venous Doppler.       Other Visit Diagnoses     Paroxysmal atrial fibrillation (HCC)       Relevant Medications   apixaban (ELIQUIS) 5 MG TABS tablet   atenolol (TENORMIN) 50 MG tablet   Other Relevant Orders   EKG 12-Lead   ECHOCARDIOGRAM COMPLETE   Lower extremity edema       Relevant Orders   VAS [place] LOWER EXTREMITY VENOUS (DVT)       ===================================  HPI:    Sydney Vaughan is an obese 81 y.o. 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 provider found.-Is followed by Danae Orleans, MD  Sydney Vaughan was last seen by Dr.  Arby Barrette on August 05, 2021.  She had been referred to nutritionist, but chose not to go.  Had lost 46 pounds in 3 years reducing BMI 35 by cutting back her caloric intake.  Indicated that she does not want to be on statin or other lipid management.  Most recent labs from that timeframe showed LDL 156 with HDL 37.  She has baseline balance issues working  with orthopedics.  Significant depression and anxiety-reluctant to take Prozac simply uses PRN Klonopin (upset that nobody wants to take over refilling the Klonopin.).  Recent Hospitalizations:  Admitted 03/21/2022-[facility]: Speech change/weakness.  Episode lasted about 40 minutes where she could not speak.  Upon arrival to the ER, code stroke was called.  CT and MRI performed.  Found to have occlusion of the left M2 division shortly after bifurcation but been chronic-infarct seen on noncontrast head CT. Symptoms appear much resolved, thought to be TIA.  Seen by neurology who advised aspirin +3 months of Plavix along with high-dose statin. TTE performed, normal Zio placed.Marland Kitchen  UA suggested UTI started on ceftriaxone discharged on nitrofurantoin Thyroid abnormality seen on CT scan scheduled for outpatient thyroid ultrasound. Noted asymptomatic bradycardia therefore atenolol dose was reduced.  Reviewed  CV studies:    The following studies were reviewed today: (if available, images/films reviewed: From Epic Chart or Care Everywhere) CT angio head neck 03/21/2022: Patent bilateral carotid arteries with no dissection or aneurysm.  Minimal calcified plaque in the aortic arch.  Common origin of brachiocephalic and left common carotid arteries.  Patent subclavian arteries patent vertebral arteries.  Mild Plaque in Intracranial ICAs without Significant Stenosis.  Right MCA Patent.  Occlusion of the Superior Left M2 Division Shortly after Bifurcation-Corresponds to Prior Infarct [place] Seen on Noncontrast CT of the Left MCA Branches Are Patent.  Bilateral ICAs Patent  with Hypoplastic Right A1 Segment.  No AVM.  Vaughan PICA.  Thyroid Nodule 2.7 Cm Noted.  Few Small Nodules in the RUL Measuring 3 to 4 Mm Likely Nonspecific. Brain MRA 03/21/2022: No acute process.  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  Old infarcts of the left frontal and right occipital lobes noted. 7-day Zio patch monitor 5/2-07/2022: Predominantly sinus rhythm with a rate range of 45 to 113 bpm, average 56 bpm. 4 short episodes of PAT fastest was 6 beats at a max rate 154 bpm, longest was 9 beats with an average of 113 bpm. Significant A-fib burden of roughly 45%. Heart rate range 53 to 196 bpm. Longest episode was 1 day and 22 hours with an average rate of 90 bpm. Rare PACs with couplets and triplets, occasional PVCs (1.7%).   Interval History:   Sydney Vaughan presents today for evaluation of A-fib.  She seems to be relatively incredulous about anything being wrong with her heart.  She says she has always had irregular heartbeat for long she can member and she usually treats this by taking her Tenormin and as needed Klonopin.  She says when she gets anxious and upset she takes a lot of Klonopin.  That is what she feels currently with her heart rate being irregular.  She feels it as a anxiety symptom. She is somewhat upset that they reduced her Tenormin dose.  She does not notice fast heart rates, and not necessarily really the irregularity of it.  Just the general sense of something being "ill at ease ".  She really only notes her heart rate going up when she is active or anxious.  She has not really noticed any significant bleeding or bruising on aspirin and Plavix started in the hospital.  No real PND and orthopnea, does have exertional dyspnea at baseline. No further confusion or TIA type symptoms. She has chronic left greater than right leg swelling-no beta-blockade Poor balance was a year follow-up with no recent falls.  CV Review of Symptoms  (Summary) Cardiovascular ROS: positive for -  dyspnea on exertion, edema, shortness of breath, and does not describe the feeling of irregular or rapid heartbeats.  Chest feels a sense of uneasiness in the chest.  This has been associated with some dyspnea. negative for - irregular heartbeat, murmur, palpitations, paroxysmal nocturnal dyspnea, rapid heart rate, shortness of breath, or any further TIA or amaurosis fugax symptoms.  She did have the one spell that led to hospitalization but that was short-lived.  Does not walk that much could really determine if she had active claudication.  REVIEWED OF SYSTEMS   Review of Systems  Constitutional:  Positive for malaise/fatigue (Has not really had a lot of energy in the last couple months.  Was attributing this to her being on a lot of stress-her daughter is dying from terminal cancer.). Negative for weight loss.  HENT:  Negative for congestion and nosebleeds.   Respiratory:  Negative for shortness of breath.   Cardiovascular:  Positive for leg swelling (Left leg swelling and pain).       Just notes worsening fatigue and may be exercise intolerance.  Gastrointestinal:  Negative for blood in stool and melena.  Genitourinary:  Positive for frequency. Negative for dysuria and hematuria.  Musculoskeletal:  Positive for back pain and joint pain. Negative for falls (Has not had real falls, but has lost her balance several times) and myalgias.  Neurological:  Positive for dizziness, seizures, weakness and headaches. Negative for focal weakness.  Psychiatric/Behavioral:  Positive for depression and memory loss. The patient is nervous/anxious. The patient does not have insomnia.        Lots of social stress.  She told me that her daughter is dying from cancer.  She cries a lot when she thinks about it.   I have reviewed and (if needed) personally updated the patient's problem list, medications, allergies, past medical and surgical history, social and family  history.   PAST MEDICAL HISTORY   Past Medical History:  Diagnosis Date   Atrial fibrillation with rapid ventricular response ([place]) 03/2022   New diagnosis setting of UTI and TIA.-Next seen on monitor.   History of cardioembolic stroke    History of tobacco abuse    Hyperlipidemia with target LDL less than 70    Unfortunately, not willing to take statin.  Did not start statin that was ordered in the hospital.  She is very fearful of side effects.   Obesity    Obesity (BMI 35.0-39.9 without comorbidity)    Ovarian cancer ([place])    BRCA1 carrier-s/p bilateral mastectomy   TIA due to embolism ([place]) 03/2022    PAST SURGICAL HISTORY   History reviewed. No pertinent surgical history.   There is no immunization history on file for this patient.  MEDICATIONS/ALLERGIES   Current Meds  Medication Sig   apixaban (ELIQUIS) 5 MG TABS tablet Take 1 tablet (5 mg total) by mouth 2 (two) times daily.   clonazePAM (KLONOPIN) 0.5 MG tablet Take 1 tablet by mouth daily as needed.   [DISCONTINUED] aspirin 81 MG chewable tablet Chew by mouth daily.   [DISCONTINUED] atenolol (TENORMIN) 50 MG tablet Take 0.5 tablets (25 mg total) by mouth daily.    No Known Allergies  SOCIAL HISTORY/FAMILY HISTORY   Reviewed in Epic:   Social History   Tobacco Use   Smoking status: Former    Types: Cigarettes   Smokeless tobacco: Never  Substance Use Topics   Alcohol use: Not Currently   Drug use: Never   Social History   Social  History Narrative   Very upset.  Daughter is dying of cancer.  She feels very guilty.   Cries a lot.   Also, recently moved out of her house to downsize after living there for 40 years.  Been quite emotional event.  Lots of social stress.   Family History  Family history unknown: Yes    OBJCTIVE -PE, EKG, labs   Wt Readings from Last 3 Encounters:  04/07/22 237 lb 8 oz (107.7 kg)  03/22/22 229 lb 15 oz (104.3 kg)    Physical Exam: BP (!) 149/96 (BP Location: Left  Arm, Patient Position: Sitting, Cuff Size: Normal)   Pulse (!) 115   Ht _0  (1.676 m)   Wt 237 lb 8 oz (107.7 kg)   SpO2 93%   BMI 38.33 kg/m  Physical Exam Vitals reviewed.  Constitutional:      General: She is not in acute distress.    Appearance: She is not toxic-appearing.     Comments: Obese.  Well-groomed.  Actually looks younger than stated age.  Seems somewhat stressed.  HENT:     Head: Normocephalic and atraumatic.  Neck:     Vascular: Carotid bruit (Cannot exclude soft bilateral bruits.  Could be radiated aortic murmur.) present.  Cardiovascular:     Rate and Rhythm: Tachycardia present. Rhythm irregularly irregular.     Chest Wall: PMI is not displaced.     Pulses: Decreased pulses.     Heart sounds: S1 normal and S2 normal. Heart sounds are distant. No murmur heard.   No friction rub. No gallop.  Pulmonary:     Effort: Pulmonary effort is normal. No respiratory distress.     Breath sounds: Normal breath sounds. No wheezing or rales.  Chest:     Chest wall: No tenderness.  Abdominal:     General: Abdomen is flat. Bowel sounds are normal. There is no distension.     Palpations: Abdomen is soft. There is no mass.     Tenderness: There is no abdominal tenderness. There is no guarding.     Comments: No HSM or bruit  Musculoskeletal:        General: Swelling (Mild puffy ankle swelling) present.     Cervical back: Normal range of motion and neck supple.  Skin:    General: Skin is warm and dry.     Coloration: Skin is not pale.  Neurological:     General: No focal deficit present.     Mental Status: She is alert and oriented to person, place, and time.     Cranial Nerves: No cranial nerve deficit.  Psychiatric:        Mood and Affect: Mood normal.        Thought Content: Thought content normal.        Judgment: Judgment normal.     Comments: Somewhat tearful.  Seems to be under a lot of stress.  When she finally talked about it she broke down in tears.      Adult ECG Report  Rate: 115 ;  Rhythm: atrial fibrillation and RVR.  Otherwise normal axis, intervals and durations.  No prior EKG. ;   Narrative Interpretation: Abnormal  Recent Labs: Reviewed Lab Results  Component Value Date   CHOL 167 03/22/2022   HDL 32 (L) 03/22/2022   LDLCALC 118 (H) 03/22/2022   TRIG 83 03/22/2022   CHOLHDL 5.2 03/22/2022   Lab Results  Component Value Date   CREATININE 0.86 03/22/2022   BUN 19  03/22/2022   NA 142 03/22/2022   K 3.8 03/22/2022   CL 111 03/22/2022   CO2 24 03/22/2022      Latest Ref Rng & Units 03/22/2022    6:48 AM 03/21/2022    6:49 PM 07/13/2013   12:09 PM  CBC  WBC 4.0 - 10.5 K/uL 7.6   8.4   6.9    Hemoglobin 12.0 - 15.0 g/dL 12.0   12.9   8.9    Hematocrit 36.0 - 46.0 % 35.3   40.9   25.2    Platelets 150 - 400 K/uL 135   167   49      Lab Results  Component Value Date   HGBA1C 4.5 (L) 03/21/2022   No results found for: [facility]  [facility] Related to Lipid Panel Component 08/05/21  12/03/18   Triglycerides 111 148  Cholesterol 178 223 High   HDL 30 Low  37 Low   LDL Calculated 126 High   156 High    VLDL Cholesterol Cal 22.2 29.6  Chol/HDL Ratio 5.9 High  6.0 High   Comprehensive Metabolic Panel Component 48/54/62 12/03/18  Sodium 140 141  Potassium 3.9 5.0  Chloride 105 103  CO2 26.0 26.0  Anion Gap 9 12  BUN 20 16  Creatinine 0.97 High  0.91  BUN/Creatinine Ratio 21 18  eGFR CKD-EPI (2021) Female 60  --  Glucose 97 90  Calcium 9.9 9.4  Albumin 4.0 3.7  Total Protein 7.4 6.7  Total Bilirubin 1.2 0.8  AST 12 14  ALT <7 Low  10  Alkaline Phosphatase 98 108   ==================================================  COVID-19 Education: The signs and symptoms of COVID-19 were discussed with the patient and how to seek care for testing (follow up with PCP or arrange E-visit).    I spent a total of 36 minutes with the patient spent in direct patient consultation.  Spent a lot of time talking about CHADS2 score,  has bled score, symptoms of A-fib and things to look out for.  She is very reluctant to do anything different with her medications which is understandable.  We will hold off on statin for now, but will likely readdress in the future.  I would like to get her on a Vaughan regimen for A-fib which is basically rate control. Additional time spent with chart review  / charting (studies, outside notes, etc): 28 min Total Time: 64 min  Current medicines are reviewed at length with the patient today.  (+/- concerns) does not want take statin.  Very concerned about other medications.  This visit occurred during the SARS-CoV-2 public health emergency.  Safety protocols were in place, including screening questions prior to the visit, additional usage of staff PPE, and extensive cleaning of exam room while observing appropriate contact time as indicated for disinfecting solutions.  Notice: This dictation was prepared with Dragon dictation along with smart phrase technology. Any transcriptional errors that result from this process are unintentional and may not be corrected upon review.   Studies Ordered:  Orders Placed This Encounter  Procedures   EKG 12-Lead   ECHOCARDIOGRAM COMPLETE   VAS [place] LOWER EXTREMITY VENOUS (DVT)   Meds ordered this encounter  Medications   apixaban (ELIQUIS) 5 MG TABS tablet    Sig: Take 1 tablet (5 mg total) by mouth 2 (two) times daily.    Dispense:  60 tablet    Refill:  6    Stopping plavix   atenolol (TENORMIN) 50 MG  tablet    Sig: Take 0.5 tablets (25 mg total) by mouth 2 (two) times daily.    Patient Instructions / Medication Changes & Studies & Tests Ordered   Patient Instructions  Medication Instructions:  - Your physician has recommended you make the following change in your medication:   1) CHANGE Atenolol 50 mg: - take 0.5 tablet (25 mg) by mouth TWICE daily   2) STOP Aspirin  3) STOP Plavix  4) START Eliquis 5 mg: - take 1 tablet by mouth twice  daily    *If you need a refill on your cardiac medications before your next appointment, please call your pharmacy*   Lab Work: - none ordered  If you have labs (blood work) drawn today and your tests are completely normal, you will receive your results only by: MyChart Message (if you have MyChart) OR A paper copy in the mail If you have any lab test that is abnormal or we need to change your treatment, we will call you to review the results.   Testing/Procedures:  1) Echocardiogram: - Your physician has requested that you have an echocardiogram. Echocardiography is a painless test that uses sound waves to create images of your heart. It provides your doctor with information about the size and shape of your heart and how well your heart's chambers and valves are working. This procedure takes approximately one hour. There are no restrictions for this procedure. There is a possibility that an IV may need to be started during your test to inject an image enhancing agent. This is done to obtain more optimal pictures of your heart. Therefore we ask that you do at least drink some water prior to coming in to hydrate your veins.    2) Lower Extremity Venous Ultrasound: - Your physician has requested that you have a lower or upper extremity venous duplex. This test is an ultrasound of the veins in the legs or arms. It looks at venous blood flow that carries blood from the heart to the legs or arms. Allow one hour for a Lower Venous exam. Allow thirty minutes for an Upper Venous exam. There are no restrictions or special instructions.     Follow-Up: At [facility], you and your health needs are our priority.  As part of our continuing mission to provide you with exceptional heart care, we have created designated Provider Care Teams.  These Care Teams include your primary Cardiologist (physician) and Advanced Practice Providers (APPs -  Physician Assistants and Nurse Practitioners) who all work  together to provide you with the care you need, when you need it.  We recommend signing up for the patient portal called "MyChart".  Sign up information is provided on this After Visit Summary.  MyChart is used to connect with patients for Virtual Visits (Telemedicine).  Patients are able to view lab/test results, encounter notes, upcoming appointments, etc.  Non-urgent messages can be sent to your provider as well.   To learn more about what you can do with MyChart, go to [email or web address].    Your next appointment:   2 month(s)  The format for your next appointment:   In Person  Provider:   Glenetta Hew, MD    Other Instructions  Echocardiogram An echocardiogram is a test that uses sound waves (ultrasound) to produce images of the heart. Images from an echocardiogram can provide important information about: Heart size and shape. The size and thickness and movement of your heart's walls. Heart muscle function and strength.  Heart valve function or if you have stenosis. Stenosis is when the heart valves are too narrow. If blood is flowing backward through the heart valves (regurgitation). A tumor or infectious growth around the heart valves. Areas of heart muscle that are not working well because of poor blood flow or injury from a heart attack. Aneurysm detection. An aneurysm is a weak or damaged part of an artery wall. The wall bulges out from the normal force of blood pumping through the body. Tell a health care provider about: Any allergies you have. All medicines you are taking, including vitamins, herbs, eye drops, creams, and over-the-counter medicines. Any blood disorders you have. Any surgeries you have had. Any medical conditions you have. Whether you are pregnant or may be pregnant. What are the risks? Generally, this is a safe test. However, problems may occur, including an allergic reaction to dye (contrast) that may be used during the test. What happens before  the test? No specific preparation is needed. You may eat and drink normally. What happens during the test?  You will take off your clothes from the waist up and put on a hospital gown. Electrodes or electrocardiogram (ECG)patches may be placed on your chest. The electrodes or patches are then connected to a device that monitors your heart rate and rhythm. You will lie down on a table for an ultrasound exam. A gel will be applied to your chest to help sound waves pass through your skin. A handheld device, called a transducer, will be pressed against your chest and moved over your heart. The transducer produces sound waves that travel to your heart and bounce back (or "echo" back) to the transducer. These sound waves will be captured in real-time and changed into images of your heart that can be viewed on a video monitor. The images will be recorded on a computer and reviewed by your health care provider. You may be asked to change positions or hold your breath for a short time. This makes it easier to get different views or better views of your heart. In some cases, you may receive contrast through an IV in one of your veins. This can improve the quality of the pictures from your heart. The procedure may vary among health care providers and hospitals. What can I expect after the test? You may return to your normal, everyday life, including diet, activities, and medicines, unless your health care provider tells you not to do that. Follow these instructions at home: It is up to you to get the results of your test. Ask your health care provider, or the department that is doing the test, when your results will be ready. Keep all follow-up visits. This is important. Summary An echocardiogram is a test that uses sound waves (ultrasound) to produce images of the heart. Images from an echocardiogram can provide important information about the size and shape of your heart, heart muscle function, heart valve  function, and other possible heart problems. You do not need to do anything to prepare before this test. You may eat and drink normally. After the echocardiogram is completed, you may return to your normal, everyday life, unless your health care provider tells you not to do that. This information is not intended to replace advice given to you by your health care provider. Make sure you discuss any questions you have with your health care provider. Document Revised: 07/21/2021 Document Reviewed: 06/30/2020 Elsevier Patient Education  [place]  Glenetta Hew, M.D., M.S. Interventional Cardiologist   Pager # [phone] Phone # [phone] [address] [place], [place] [address]   Thank you for choosing [facility] [place]!!

## 2022-04-08 ENCOUNTER — Telehealth: Payer: Self-pay | Admitting: Cardiology

## 2022-04-08 MED ORDER — ATENOLOL 50 MG PO TABS: 25.0000 mg | ORAL_TABLET | Freq: Two times a day (BID) | ORAL | Status: DC

## 2022-04-08 NOTE — Telephone Encounter (Signed)
Attempted to schedule venous echo and fu visit with harding .     Patient states she is not sure when she would be able to make arrangements to come in and would like a call back on Monday to discuss

## 2022-04-08 NOTE — Telephone Encounter (Signed)
Pt c/o medication issue:  1. Name of Medication:  apixaban (ELIQUIS) 5 MG TABS tablet  2. How are you currently taking this medication (dosage and times per day)?   3. Are you having a reaction (difficulty breathing--STAT)?   4. What is your medication issue?   Patient hasn't started this medication. She would like to know if we can help her apply for patient assistance.

## 2022-04-08 NOTE — Telephone Encounter (Signed)
Spoke with pt, she is needing patient assistance. She does not have any right now, she was not given samples at the time of the appointment. She is having a scan next week and would like to pick up the paperwork for assistance when she is there. Patient was seen in the [place] office. Will forward to them.

## 2022-04-09 ENCOUNTER — Encounter: Payer: Self-pay | Admitting: Cardiology

## 2022-04-09 DIAGNOSIS — Z8673 Personal history of transient ischemic attack (TIA), and cerebral infarction without residual deficits: Secondary | ICD-10-CM | POA: Insufficient documentation

## 2022-04-09 DIAGNOSIS — M7989 Other specified soft tissue disorders: Secondary | ICD-10-CM | POA: Insufficient documentation

## 2022-04-09 NOTE — Assessment & Plan Note (Signed)
She needs to quit, but currently using it as a crutch.  We can discuss in follow-up.

## 2022-04-09 NOTE — Assessment & Plan Note (Signed)
Painful swelling of left leg.  Concerning for possible DVT.  Even though it was starting her on Eliquis, would like to exclude DVT.  Also likely few venous stasis.  Plan: Low extremity venous Doppler.

## 2022-04-09 NOTE — Assessment & Plan Note (Signed)
She has had fast heart rate spells in the past.  She is always been on relatively high dose of Tenormin.  She previously was taking 50 mg daily, reduced to 25 mg because of bradycardia in the hospital.  In the past she would have these spells of fast heart rates that she attributed to anxiety.  She usually treats them with Klonopin.Marland Kitchen  Klonopin plus resting usually it is 0 a.m. she did not have any other untoward symptoms.

## 2022-04-09 NOTE — Assessment & Plan Note (Signed)
I truthfully think she would probably do better on long-term therapy but does not want to take many other medications.  She is happy with Klonopin which treats her symptoms.  She does seem to be under a lot of stress very tearful with me today.  The fact that her daughter is very sick and dying from cancer is very distressing to her.  She is also trying to move her house in the midst of all of the other activities.  I think she would probably benefit from an SSRI, but would defer to primary.

## 2022-04-09 NOTE — Assessment & Plan Note (Signed)
With recent TIA and history of prior stroke, it is quite possible that she has had A-fib for longer than we know now.  Monitor did show A-fib at least 47%.  She does not feel being in A-fib.  Just the concern.  Thankfully no residual symptoms from her stroke.  Plan:   With evidence of A-fib, recommend stopping aspirin Plavix and starting Eliquis 5 mg twice daily.  2D Echo Pending

## 2022-04-09 NOTE — Assessment & Plan Note (Signed)
New diagnosis of A-fib RVR, 46%, so it is not distal foot.  This clearly was probably the etiology for her TIA.  Thankfully it was only TIA.  She does have a history of prior infarct.  Since she is not really having any symptoms whatsoever being in A-fib, I do not see a reason for treating with antiarrhythmics.  Based on the burden I do not think is worth it to cardiovert unless she is having symptoms.  Plan: Continue with Tenormin but will increase back to 50 mg daily.  For now I am okay with her using as needed Klonopin for tachycardia spells.  Start Eliquis 5 mg twice daily  Check 2D echo  Since she is already on 115 beats a minute with no real dyspnea or chest comfort, I do not think we necessarily need to do an ischemic evaluation.  (Essentially passing her stress test)   This patients CHA2DS2-VASc Score and unadjusted Ischemic Stroke Rate (% per year) is equal to 11.2 % stroke rate/year from a score of 7  Above score calculated as 1 point each if present [CHF, HTN, DM, Vascular=MI/PAD/Aortic Plaque, Age if 65-74, or Female] Above score calculated as 2 points each if present [Age > 75, or Stroke/TIA/TE]

## 2022-04-09 NOTE — Assessment & Plan Note (Signed)
Now with evidence of A-fib, I think we are okay stopping aspirin Plavix and switching to Eliquis.  Spent a lot of time talking about her CHA2DS2-VASc score versus has bled score.  I also indicated that Eliquis is probably has a lesser risk profile then either aspirin or Plavix.

## 2022-04-09 NOTE — Assessment & Plan Note (Signed)
Was supposedly started on high-dose statin in the hospital.  She does not want take it.  Stating that she is scared of side effects.  I explained to her that pros and cons of taking or not taking.  She is very concerned about side effects.  She has read a lot about this and is concerned about the atorvastatin.  Plan: For now we will hold off on any lipid-lowering agent, but in the future may want to consider rosuvastatin

## 2022-04-09 NOTE — Assessment & Plan Note (Signed)
Pressure is high today despite having tachycardia at 115 bpm.  She is on relatively high dose of Tenormin that was recently decreased.  We will increase back to 50 mg daily.

## 2022-04-11 NOTE — Telephone Encounter (Signed)
Reviewed the patient's chart.  She is currently not scheduled for any "scans" that I can see. Dr. Ellyn Hack saw her in the office on 04/07/22 and ordered an echo and a lower venous duplex.   In looking at that patient's chart today, she had an echo done on 03/22/22. I did clarify with Dr. Ellyn Hack that the patient does not need a repeat echocardiogram at this time.  She will just need to be scheduled for a lower venous study. Scheduling attempted to reach out to her last week to arrange this.  The patient did not want to schedule at that time due to transportation issues and advised she would reach back out.   In regards to the patient's eliquis RX, she was seen late on Thursday afternoon and I inadvertantly did not pull samples for her or give her a free 30-day trial card. However, her RX was sent to the pharmacy.   I have pulled a 30-day free trial card for her. Will print off a patient assistance form for pick up and pull samples once I speak with the patient.   Attempted to call the patient. No answer- I left a message to please call back (no DPR on file).

## 2022-04-11 NOTE — Telephone Encounter (Signed)
Sydney Vaughan- she no longer needs the echo per Dr. Ellyn Hack, just the venous next available.  Thanks!

## 2022-04-14 ENCOUNTER — Telehealth: Payer: Self-pay | Admitting: Cardiology

## 2022-04-14 NOTE — Telephone Encounter (Signed)
Attempted to call the patient. No answer- I left a message to please call back.  

## 2022-04-14 NOTE — Telephone Encounter (Signed)
Attempted to call the patient. No answer- I left a message to please call back.   Scheduling/ Call center: If she calls back please let me know as I am having a hard time reaching her.   See phone note dated: 5/19 for this same issue- will close this encounter- please work off of open encounter dated 5/19.

## 2022-04-14 NOTE — Telephone Encounter (Signed)
Calling with questions bout the ultrasound that dr harding wanting her to do. Please advise

## 2022-04-14 NOTE — Telephone Encounter (Signed)
Pt c/o medication issue:  1. Name of Medication: apixaban (ELIQUIS) 5 MG TABS tablet  2. How are you currently taking this medication (dosage and times per day)? Take 1 tablet (5 mg total) by mouth 2 (two) times daily  3. Are you having a reaction (difficulty breathing--STAT)? no  4. What is your medication issue? Hasnt started taking the medication yet unable to afford it. Calling to see if she can get assistant or what can be done. Please advise

## 2022-04-14 NOTE — Telephone Encounter (Signed)
Attempted to call the patient. No answer- I left a message to please call back.     Scheduling/ Call center: If she calls back please let me know as I am having a hard time reaching her.    See phone note dated: 5/19 for this same issue- will close this encounter- please work off of open encounter dated 5/19.

## 2022-04-15 NOTE — Telephone Encounter (Signed)
Patient returned call. Advised her of 30 day free trial card that was being printed off for her, and that the nurse would call her back Tuesday morning regarding this.

## 2022-04-19 MED ORDER — APIXABAN 5 MG PO TABS: 5.0000 mg | ORAL_TABLET | Freq: Two times a day (BID) | ORAL | 6 refills | Status: DC

## 2022-04-19 NOTE — Telephone Encounter (Signed)
Follow Up:    Patient is calling to see if there is paperwork at the front desk to help her with her medicine?Marland Kitchen

## 2022-04-19 NOTE — Telephone Encounter (Signed)
The patient called back to the office today and scheduling was able to transfer her directly to me.  I advised/ apologized to the patient I was unable to reach her by phone last week, but her phone would ring once then go to voicemail.  The patient is aware: 1) I will pull eliquis samples for her 2) give her a free 30 day trial card 3) give her patient assistance paperwork for eliquis  She is also requesting a paper RX so she can price check this at the pharmacy as well.   She is aware I can have all of this ready for her by Thursday when Dr. Ellyn Hack is back in the office.   I also advised the patient that she is currently scheduled for: 1) 6/1 with Dr. Ellyn Hack, but this is an old appointment and she should not need this 2) 6/2 a lower extremity venous ultrasound at 4:00 pm in our office.  Per the patient, she was unaware of the date of the venous ultrasound, although our office has tried to reach her by phone to schedule. She then went on to state that she wonders if the venous ultrasound is even necessary as her affected extremity "has gone down tremendously," and she just has some swelling in her 3rd toe that is not new. Her foot will occasionally get puffy, but again this is not new for her.  She denies any redness to the affected lower extremity/ no temperature change.   I advised the patient I will review with Dr. Ellyn Hack to see if he would still like her to proceed with a venous study and if not, does he still want to see her back in 2 months.  The patient is aware I will try to call her tomorrow at the latest.  She voices understanding and is agreeable.

## 2022-04-19 NOTE — Telephone Encounter (Signed)
Samples pulled: Eliquis 5 mg Lot: [id number] Exp: 02/2024 # 4 boxes  Free trial card pulled  Assistance Application printed

## 2022-04-20 ENCOUNTER — Encounter: Payer: Self-pay | Admitting: *Deleted

## 2022-04-20 NOTE — Telephone Encounter (Signed)
Attempted to call the patient x 2 attempts with Dr. Allison Quarry recommendations.  Her phone rings once then goes to voice mail, however her voice mail is full.  Will attempt to contact the patient again this afternoon with the following plan:  1) Cancel appointment for 04/21/22 with Dr. Ellyn Hack  2) Keep her lower extremity venous as scheduled on 04/22/22  3) She will need to come pick up samples/ free 30-day trial/ patient assistance forms on 04/21/22 (will have Dr. Ellyn Hack sign forms in the office on 04/21/22)  4) Bring back completed assistance forms on 04/22/22 if possible  5) Follow up with Dr. Ellyn Hack in 3-4 weeks

## 2022-04-20 NOTE — Telephone Encounter (Signed)
The patient called back to the office. I have notified her of Dr. Allison Quarry recommendations and plan as listed below. The patient voices understanding and is agreeable. She prefers to pick up her samples on Friday 04/22/22 when she is back in the office for her venous duplex.  She is aware we will cancel her appointment for 04/21/22 with Dr. Ellyn Hack and schedule her to be seen in ~ 3-4 weeks. She is agreeable with an appointment on 05/19/22 at 4:00 pm.  The patient voices understanding of all recommendations and is agreeable.

## 2022-04-20 NOTE — Telephone Encounter (Signed)
Makes more sense for her to have the Doppler study performed and to be on DOAC prior to being seen.  Would move appt from 6/1 to a later date after her study & getting Rx filled.  Glenetta Hew, MD

## 2022-04-21 ENCOUNTER — Ambulatory Visit: Payer: Medicare Other | Admitting: Cardiology

## 2022-04-22 ENCOUNTER — Ambulatory Visit (INDEPENDENT_AMBULATORY_CARE_PROVIDER_SITE_OTHER): Payer: Medicare Other

## 2022-04-22 DIAGNOSIS — R600 Localized edema: Secondary | ICD-10-CM

## 2022-05-02 ENCOUNTER — Telehealth: Payer: Self-pay | Admitting: *Deleted

## 2022-05-02 NOTE — Telephone Encounter (Signed)
Pt is returning call. Transferred to Pamela Allen, RN.  

## 2022-05-02 NOTE — Telephone Encounter (Signed)
Results reviewed with patient and she verbalized understanding with no further questions at this time.  °

## 2022-05-02 NOTE — Telephone Encounter (Signed)
Left voicemail message for patient to call back for review of results  

## 2022-05-02 NOTE — Telephone Encounter (Signed)
-----   Message from Leonie Man, MD sent at 04/29/2022 10:08 PM EDT ----- Lower extremity vein Dopplers No evidence of deep vein thrombosis in the left leg.  Glenetta Hew, MD

## 2022-05-03 ENCOUNTER — Other Ambulatory Visit: Payer: Self-pay | Admitting: *Deleted

## 2022-05-03 MED ORDER — APIXABAN 5 MG PO TABS: 5.0000 mg | ORAL_TABLET | Freq: Two times a day (BID) | ORAL | 1 refills | Status: DC

## 2022-05-03 NOTE — Telephone Encounter (Signed)
Prescription refill request for Eliquis received.  Indication: afib  Last office visit: Ellyn Hack, 04/07/2022 Scr: 0.86, 03/22/2022 Age: 81 yo  Weight: 107.7 kg   [facility]

## 2022-05-19 ENCOUNTER — Ambulatory Visit: Payer: Medicare Other | Admitting: Cardiology

## 2022-06-28 ENCOUNTER — Telehealth: Payer: Self-pay | Admitting: Cardiology

## 2022-06-28 NOTE — Telephone Encounter (Signed)
Late Entry:  Fax received dated 06/16/22 stating that an application for assistance was received for this patient.  The application is being reviewed for eligibilty for Eliquis assistance.

## 2022-07-04 ENCOUNTER — Telehealth: Payer: Self-pay | Admitting: Cardiology

## 2022-07-04 NOTE — Telephone Encounter (Signed)
LVM to reschedule.

## 2022-07-14 ENCOUNTER — Ambulatory Visit: Payer: Medicare Other | Admitting: Cardiology

## 2022-07-20 ENCOUNTER — Telehealth: Payer: Self-pay | Admitting: Cardiology

## 2022-07-20 NOTE — Telephone Encounter (Signed)
Spoke to patient she stated she needs a prior authorization for Eliquis to lower the tier.Stated she was denied patient assistance.Advised I will send message to our prior auth nurse.

## 2022-07-20 NOTE — Telephone Encounter (Signed)
Pt c/o medication issue:  1. Name of Medication:   apixaban (ELIQUIS) 5 MG TABS tablet    2. How are you currently taking this medication (dosage and times per day)? Take 1 tablet (5 mg total) by mouth 2 (two) times daily.  3. Are you having a reaction (difficulty breathing--STAT)? No  4. What is your medication issue? Pt called to give below number to call for a "Lower Tier" so that she is able to afford above medication. Please advise   [phone]

## 2022-07-21 ENCOUNTER — Ambulatory Visit: Payer: Medicare Other | Admitting: Cardiology

## 2022-07-22 NOTE — Telephone Encounter (Signed)
Sent to [employer] regarding eliquis tier exception

## 2022-07-22 NOTE — Telephone Encounter (Signed)
**Note De-Identified Engelbert Sevin Obfuscation** Forwarding to [facility] so they can advise the pt or to forward to Dr Allison Quarry nurse for advisement to the pt.

## 2022-07-22 NOTE — Telephone Encounter (Signed)
No because they pay a % of the drug cost no matter what tier the medication is on.

## 2022-08-26 ENCOUNTER — Telehealth: Payer: Self-pay | Admitting: Cardiology

## 2022-08-26 NOTE — Telephone Encounter (Signed)
Pt c/o medication issue:  1. Name of Medication:   apixaban (ELIQUIS) 5 MG TABS tablet    2. How are you currently taking this medication (dosage and times per day)? Take 1 tablet (5 mg total) by mouth 2 (two) times daily.  3. Are you having a reaction (difficulty breathing--STAT)? No  4. What is your medication issue? Pt would like a callback regarding getting help with cost of medication. Pt states that she is running low on this medication. Please advise

## 2022-08-29 NOTE — Telephone Encounter (Signed)
Left voicemail message to call back  

## 2022-08-30 NOTE — Telephone Encounter (Signed)
The patient called back. I have advised her that I spoke with [employer] and that we could try to fax them an updated expense report from her pharmacy to see if her out of pocket expenses will now qualify her for free medication.  Per the patient, she is currently getting this from her mail order and paying ~ $127 for a 90-day supply. She was wanting a tier exception as she is not having to pay for her other medications.   I have advised the patient we will not be able to get a tier exception for eliquis due to the nature of the drug and all other drugs in this class are still branded.   The patient advised that her only out of pocket expense was the last time she had this filled for ~ $127.  I advised the patient that Medicare patients are still having to pay some out of pocket cost for this drug, it is not $0.  The patient advised that she may just quit taking this and I explained to her that I advised against that due to her stroke risk.  The patient states she will discuss this further with Dr. Ellyn Hack at her next appointment on 09/16/22. She states she has enough medication to last until then.  We briefly discussed warfarin, but again she stated she will review with Dr. Ellyn Hack.

## 2022-08-30 NOTE — Telephone Encounter (Signed)
Reviewed the patient's chart.  An application for assistance for Eliquis was submitted in July 2023. I have a fax confirmation dated 06/16/22 that this was received by [employer] and was under review.  I see nothing else received after this date from [employer], but the patient called in in August stating she was not approved and asking for a tier exception.  I am unsure if the patient submitted her own application for assistance as I do not have a copy of what was originally sent in.  I called [employer] today at [phone] to find out if we could just submit an updated exspense report from her pharmacy at this time to see if she will now qualify for assistance while in the donut hole.  Per Deanna, with [employer], the patient never submitted any out of pocket expenses with her original application. She will need to have spent $705.49 to qualify. I have confirmed with Deanna, if the patient could get [place] an updated expense report, then we could just fax this to add to her application from July and she may now qualify.  I attempted x 2 tries to contact the patient to discuss, but both times, her phone rings once and then goes to voice mail.  I have left a message for her to please call back to discuss.

## 2022-08-30 NOTE — Telephone Encounter (Signed)
Patient is returning RN's call. Please advise. 

## 2022-09-16 ENCOUNTER — Ambulatory Visit: Payer: Medicare Other | Admitting: Cardiology

## 2022-09-26 ENCOUNTER — Ambulatory Visit: Payer: Medicare Other | Admitting: Cardiology

## 2022-10-11 ENCOUNTER — Ambulatory Visit: Payer: Medicare Other | Admitting: Cardiology

## 2022-11-03 ENCOUNTER — Ambulatory Visit: Payer: Medicare Other | Admitting: Cardiology

## 2022-11-06 ENCOUNTER — Other Ambulatory Visit: Payer: Self-pay | Admitting: Cardiology

## 2022-11-06 DIAGNOSIS — I4891 Unspecified atrial fibrillation: Secondary | ICD-10-CM

## 2022-11-07 NOTE — Telephone Encounter (Signed)
Prescription refill request for Eliquis received. Indication: Afib  Last office visit: 04/07/22 Ellyn Hack)  Scr: 0.86 (03/22/22)  Age: 81 Weight: 107.7kg  Appropriate dose and refill sent to requested pharmacy.

## 2022-11-24 ENCOUNTER — Encounter: Payer: Self-pay | Admitting: Cardiology

## 2022-11-24 ENCOUNTER — Ambulatory Visit: Payer: Medicare Other | Attending: Cardiology | Admitting: Cardiology

## 2022-11-24 NOTE — Progress Notes (Deleted)
Cardiology Clinic Note   Patient Name: Sydney Vaughan Date of Encounter: 11/24/2022  Primary Care Provider:  System, Provider Not In Primary Cardiologist:  None  Patient Profile    82 year old female with a past medical history of a former smoker, ovarian cancer status post bilateral mastectomy, hyperlipidemia, hypertension, depression/anxiety, and paroxysmal atrial fibrillation, history of cardioembolic stroke, who is here today to follow-up on her atrial fibrillation.  Past Medical History    Past Medical History:  Diagnosis Date   Atrial fibrillation with rapid ventricular response ([place]) 03/2022   New diagnosis setting of UTI and TIA.-Next seen on monitor.   History of cardioembolic stroke    History of tobacco abuse    Hyperlipidemia with target LDL less than 70    Unfortunately, not willing to take statin.  Did not start statin that was ordered in the hospital.  She is very fearful of side effects.   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  Allergies  No Known Allergies  History of Present Illness    Gustavo B. Barua is an 82 year old female with a past medical history of a former smoker, ovarian cancer status post bilateral mastectomy, hyperlipidemia, hypertension, depression/anxiety, and paroxysmal atrial fibrillation with a history of cardioembolic stroke.  Echocardiogram completed 03/22/2022 revealed LVEF 55 to 60%, G2 DD, trivial mitral regurgitation, aortic valve sclerosis/calcification without evidence of aortic stenosis.  She also previously wore a heart monitor which predominantly showed sinus with an average heart rate of 56 bpm, 4 short episodes of atrial tachycardia with the fastest beat lasting 6 beats for maximal 154 bpm and the longest episode lasting 9 beats with an average rate of 130 bpm.  There was a 45%  atrial fibrillation burden that was noted with rates ranging from 53 to 196 bpm.  She also had an ultrasound for unilateral leg swelling that was negative for deep vein thrombosis in the left leg.  She was last seen in clinic 04/07/2022 by Dr. Ellyn Hack for evaluation of her new onset paroxysmal atrial fibrillation that was previously RVR.  She was continued on Tenormin 50 mg daily started on apixaban 5 mg twice daily for CHA2DS2-VASc score of at least 7 and was ordered the repeat echocardiogram that was completed without changes noted.  She returns to clinic today    Home Medications    Current Outpatient Medications  Medication Sig Dispense Refill   atenolol (TENORMIN) 50 MG tablet Take 0.5 tablets (25 mg total) by mouth 2 (two) times daily.     atorvastatin (LIPITOR) 80 MG tablet Take 1 tablet (80 mg total) by mouth daily. (Patient not taking: Reported on 04/07/2022) 90 tablet 1   clonazePAM (KLONOPIN) 0.5 MG tablet Take 1 tablet by mouth daily as needed.     ELIQUIS 5 MG TABS tablet TAKE 1 TABLET BY MOUTH TWICE  DAILY 200 tablet 2   nitrofurantoin, macrocrystal-monohydrate, (MACROBID) 100 MG capsule Take 1 capsule (100 mg total) by mouth 2 (two) times daily. (Patient not taking: Reported on 04/07/2022) 8 capsule 0   No current facility-administered medications for this visit.     Family History    Family History  Family history unknown: Yes   has no family status information on file.   Social History    Social History   Socioeconomic  History   Marital status: Married    Spouse name: Not on file   Number of children: Not on file   Years of education: Not on file   Highest education level: Not on file  Occupational History   Not on file  Tobacco Use   Smoking status: Former    Types: Cigarettes   Smokeless tobacco: Never  Substance and Sexual Activity   Alcohol use: Not Currently   Drug use: Never   Sexual activity: Not Currently  Other Topics Concern   Not on file   Social History Narrative   Very upset.  Daughter is dying of cancer.  She feels very guilty.   Cries a lot.   Also, recently moved out of her house to downsize after living there for 40 years.  Been quite emotional event.  Lots of social stress.   Social Determinants of Health   Financial Resource Strain: Not on file  Food Insecurity: Not on file  Transportation Needs: Not on file  Physical Activity: Not on file  Stress: Not on file  Social Connections: Not on file  Intimate Partner Violence: Not on file     Review of Systems    General:  No chills, fever, night sweats or weight changes.  Cardiovascular:  No chest pain, dyspnea on exertion, edema, orthopnea, palpitations, paroxysmal nocturnal dyspnea. Dermatological: No rash, lesions/masses Respiratory: No cough, dyspnea Urologic: No hematuria, dysuria Abdominal:   No nausea, vomiting, diarrhea, bright red blood per rectum, melena, or hematemesis Neurologic:  No visual changes, wkns, changes in mental status. All other systems reviewed and are otherwise negative except as noted above.     Physical Exam    VS:  There were no vitals taken for this visit. , BMI There is no height or weight on file to calculate BMI.     GEN: Well nourished, well developed, in no acute distress. HEENT: normal. Neck: Supple, no JVD, carotid bruits, or masses. Cardiac: RRR, no murmurs, rubs, or gallops. No clubbing, cyanosis, edema.  Radials 2+/PT 2+ and equal bilaterally.  Respiratory:  Respirations regular and unlabored, clear to auscultation bilaterally. GI: Soft, nontender, nondistended, BS + x 4. MS: no deformity or atrophy. Skin: warm and dry, no rash. Neuro:  Strength and sensation are intact. Psych: Normal affect.  Accessory Clinical Findings    ECG personally reviewed by me today- *** - No acute changes  Lab Results  Component Value Date   WBC 7.6 03/22/2022   HGB 12.0 03/22/2022   HCT 35.3 (L) 03/22/2022   MCV 94.9 03/22/2022    PLT 135 (L) 03/22/2022   Lab Results  Component Value Date   CREATININE 0.86 03/22/2022   BUN 19 03/22/2022   NA 142 03/22/2022   K 3.8 03/22/2022   CL 111 03/22/2022   CO2 24 03/22/2022   Lab Results  Component Value Date   ALT 6 03/22/2022   AST 14 (L) 03/22/2022   ALKPHOS 66 03/22/2022   BILITOT 0.5 03/22/2022   Lab Results  Component Value Date   CHOL 167 03/22/2022   HDL 32 (L) 03/22/2022   LDLCALC 118 (H) 03/22/2022   TRIG 83 03/22/2022   CHOLHDL 5.2 03/22/2022    Lab Results  Component Value Date   HGBA1C 4.5 (L) 03/21/2022    Assessment & Plan   1.  ***  Amous Crewe, NP 11/24/2022, 8:28 AM

## 2022-12-29 NOTE — Progress Notes (Signed)
error 

## 2022-12-30 ENCOUNTER — Ambulatory Visit: Payer: Medicare Other | Attending: General Practice | Admitting: Cardiology

## 2022-12-30 ENCOUNTER — Encounter: Payer: Self-pay | Admitting: Cardiology

## 2022-12-30 VITALS — BP 122/82 | HR 99 | Ht 66.0 in | Wt 238.8 lb

## 2022-12-30 DIAGNOSIS — I4891 Unspecified atrial fibrillation: Secondary | ICD-10-CM

## 2022-12-30 DIAGNOSIS — I10 Essential (primary) hypertension: Secondary | ICD-10-CM | POA: Diagnosis not present

## 2022-12-30 MED ORDER — ATENOLOL 50 MG PO TABS: 50.0000 mg | ORAL_TABLET | Freq: Two times a day (BID) | ORAL | 0 refills | Status: DC

## 2022-12-30 NOTE — Progress Notes (Signed)
Cardiology Office Note:    Date:  12/30/2022   ID:  Sydney Vaughan, DOB [birth year], MRN [id number]  PCP:  System, Provider Not In   [facility] Providers Cardiologist:  Glenetta Hew, MD     Referring MD: No ref. provider found   Chief Complaint  Patient presents with   Follow-up    Overdue 6 month f/u, no new cardiac concerns     History of Present Illness:    Sydney Vaughan is a 82 y.o. female with a hx of atrial fibrillation, hypertension, hyperlipidemia, TIA 5/23 presenting for follow-up.  Previously seen in the clinic with atrial fibrillation RVR.  Atenolol was increased to 25 mg twice daily, Eliquis was started.  Aspirin and Plavix were stopped.  She denies symptoms of palpitations, takes Eliquis as prescribed, denies any significant bleeding issues.  States heart rate gets elevated when she gets upset sometimes.  Echo 03/2022 EF 55 to 60%, mild LA dilatation  Past Medical History:  Diagnosis Date   Atrial fibrillation with rapid ventricular response ([place]) 03/2022   New diagnosis setting of UTI and TIA.-Next seen on monitor.   History of cardioembolic stroke    History of tobacco abuse    Hyperlipidemia with target LDL less than 70    Unfortunately, not willing to take statin.  Did not start statin that was ordered in the hospital.  She is very fearful of side effects.   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 No pertinent surgical history.  Current Medications: Current Meds  Medication Sig   clonazePAM (KLONOPIN) 0.5 MG tablet Take 1 tablet by mouth daily as needed.   ELIQUIS 5 MG TABS tablet TAKE 1 TABLET BY MOUTH TWICE  DAILY   [DISCONTINUED] atenolol (TENORMIN) 50 MG tablet Take 0.5 tablets (25 mg total) by mouth 2 (two) times daily. (Patient taking differently: Take 25 mg by mouth 2 (two) times daily. [place] tablet  twice daily)     Allergies:   Patient has no known allergies.   Social History   Socioeconomic History   Marital status: Married    Spouse name: Not on file   Number of children: Not on file   Years of education: Not on file   Highest education level: Not on file  Occupational History   Not on file  Tobacco Use   Smoking status: Former    Types: Cigarettes   Smokeless tobacco: Never  Substance and Sexual Activity   Alcohol use: Not Currently   Drug use: Never   Sexual activity: Not Currently  Other Topics Concern   Not on file  Social History Narrative   Very upset.  Daughter is dying of cancer.  She feels very guilty.   Cries a lot.   Also, recently moved out of her house to downsize after living there for 40 years.  Been quite emotional event.  Lots of social stress.   Social Determinants of Health   Financial Resource Strain: Not on file  Food Insecurity: Not on file  Transportation Needs: Not on file  Physical Activity: Not on file  Stress: Not on file  Social Connections: Not on file     Family History: The patient's Family history is unknown by patient.  ROS:   Please see the history of present illness.     All  other systems reviewed and are negative.  EKGs/Labs/Other Studies Reviewed:    The following studies were reviewed today:   EKG:  EKG is  ordered today.  The ekg ordered today demonstrates atrial fibrillation, heart rate 99  Recent Labs: 03/21/2022: Magnesium 2.0 03/22/2022: ALT 6; BUN 19; Creatinine, Ser 0.86; Hemoglobin 12.0; Platelets 135; Potassium 3.8; Sodium 142  Recent Lipid Panel    Component Value Date/Time   CHOL 167 03/22/2022 0648   TRIG 83 03/22/2022 0648   HDL 32 (L) 03/22/2022 0648   CHOLHDL 5.2 03/22/2022 0648   VLDL 17 03/22/2022 0648   LDLCALC 118 (H) 03/22/2022 0648     Risk Assessment/Calculations:             Physical Exam:    VS:  BP 122/82 (BP Location: Left Arm, Patient Position: Sitting, Cuff Size: Large)    Pulse 99   Ht 5' 6"$  (1.676 m)   Wt 238 lb 12.8 oz (108.3 kg)   SpO2 99%   BMI 38.54 kg/m     Wt Readings from Last 3 Encounters:  12/30/22 238 lb 12.8 oz (108.3 kg)  04/07/22 237 lb 8 oz (107.7 kg)  03/22/22 229 lb 15 oz (104.3 kg)     GEN:  Well nourished, well developed in no acute distress HEENT: Normal NECK: No JVD; No carotid bruits CARDIAC: Irregular irregular RESPIRATORY:  Clear to auscultation without rales, wheezing or rhonchi  ABDOMEN: Soft, non-tender, non-distended MUSCULOSKELETAL:  No edema; No deformity  SKIN: Warm and dry NEUROLOGIC:  Alert and oriented x 3 PSYCHIATRIC:  Normal affect   ASSESSMENT:    1. Atrial fibrillation, unspecified type ([place])   2. Primary hypertension    PLAN:    In order of problems listed above:  Persistent atrial fibrillation, increase atenolol to 50 mg twice daily, continue Eliquis.  Denies palpitations.  Patient declined DC cardioversion.  Echo with normal EF.  Refer to EP clinic. Hypertension, BP controlled.  Continue atenolol.  Follow-up with Dr. Ellyn Hack or APP in 6 months.     Medication Adjustments/Labs and Tests Ordered: Current medicines are reviewed at length with the patient today.  Concerns regarding medicines are outlined above.  Orders Placed This Encounter  Procedures   Amb Referral to AFIB Clinic   EKG 12-Lead   Meds ordered this encounter  Medications   atenolol (TENORMIN) 50 MG tablet    Sig: Take 1 tablet (50 mg total) by mouth 2 (two) times daily.    Dispense:  180 tablet    Refill:  0    Patient Instructions  Medication Instructions:  Your physician has recommended you make the following change in your medication:  START - atenolol (TENORMIN) 50 MG tablet - Take 1 tablet by mouth 2 times daily  *If you need a refill on your cardiac medications before your next appointment, please call your pharmacy*   Lab Work: - None ordered If you have labs (blood work) drawn today and your tests are  completely normal, you will receive your results only by: [place] (if you have MyChart) OR A paper copy in the mail If you have any lab test that is abnormal or we need to change your treatment, we will call you to review the results.   Testing/Procedures: - None ordered   Follow-Up: At [facility], you and your health needs are our priority.  As part of our continuing mission to provide you with exceptional heart care, we have created designated Provider Care  Teams.  These Care Teams include your primary Cardiologist (physician) and Advanced Practice Providers (APPs -  Physician Assistants and Nurse Practitioners) who all work together to provide you with the care you need, when you need it.  We recommend signing up for the patient portal called "MyChart".  Sign up information is provided on this After Visit Summary.  MyChart is used to connect with patients for Virtual Visits (Telemedicine).  Patients are able to view lab/test results, encounter notes, upcoming appointments, etc.  Non-urgent messages can be sent to your provider as well.   To learn more about what you can do with MyChart, go to [email or web address].    Your next appointment:   6 month(s)  Provider:   Glenetta Hew, MD    Other Instructions Follow up with Afib clinc    Signed, Kate Sable, MD  12/30/2022 4:26 PM    [place]

## 2022-12-30 NOTE — Patient Instructions (Signed)
Medication Instructions:  Your physician has recommended you make the following change in your medication:  START - atenolol (TENORMIN) 50 MG tablet - Take 1 tablet by mouth 2 times daily  *If you need a refill on your cardiac medications before your next appointment, please call your pharmacy*   Lab Work: - None ordered If you have labs (blood work) drawn today and your tests are completely normal, you will receive your results only by: [place] (if you have MyChart) OR A paper copy in the mail If you have any lab test that is abnormal or we need to change your treatment, we will call you to review the results.   Testing/Procedures: - None ordered   Follow-Up: At [facility], you and your health needs are our priority.  As part of our continuing mission to provide you with exceptional heart care, we have created designated Provider Care Teams.  These Care Teams include your primary Cardiologist (physician) and Advanced Practice Providers (APPs -  Physician Assistants and Nurse Practitioners) who all work together to provide you with the care you need, when you need it.  We recommend signing up for the patient portal called "MyChart".  Sign up information is provided on this After Visit Summary.  MyChart is used to connect with patients for Virtual Visits (Telemedicine).  Patients are able to view lab/test results, encounter notes, upcoming appointments, etc.  Non-urgent messages can be sent to your provider as well.   To learn more about what you can do with MyChart, go to [email or web address].    Your next appointment:   6 month(s)  Provider:   Glenetta Hew, MD    Other Instructions Follow up with Afib clinc

## 2023-01-03 ENCOUNTER — Telehealth: Payer: Self-pay | Admitting: Cardiology

## 2023-01-03 NOTE — Telephone Encounter (Signed)
Attempted to reach patient, but no answer. Left message for patient to call back [phone]

## 2023-01-03 NOTE — Telephone Encounter (Signed)
Left message for patient to call back  

## 2023-01-03 NOTE — Telephone Encounter (Signed)
Pt would like a callback regarding medication list. Please advise

## 2023-01-03 NOTE — Telephone Encounter (Signed)
Patient is returning call.  °

## 2023-01-04 ENCOUNTER — Other Ambulatory Visit: Payer: Self-pay

## 2023-01-04 ENCOUNTER — Telehealth: Payer: Self-pay | Admitting: Cardiology

## 2023-01-04 NOTE — Telephone Encounter (Signed)
Patient called to have medication list corrected and verified. Patient wanted to have the following medications removed from her medication list:   atorvastatin (LIPITOR) 80 MG tablet  nitrofurantoin, macrocrystal-monohydrate, (MACROBID) 100 MG capsule  Patient also wanted to make sure she was taking the correct dose of the following medication:  atenolol (TENORMIN) 50 MG tablet Take 1 tablet (50 mg total) by mouth 2 (two) times daily.

## 2023-01-04 NOTE — Telephone Encounter (Signed)
patient is returning call about problem with medication list

## 2023-01-04 NOTE — Telephone Encounter (Signed)
Left a message for the patient to call back.  

## 2023-01-24 NOTE — Progress Notes (Signed)
I,Sydney Vaughan,acting as a scribe for [employer], MD.,have documented all relevant documentation on the behalf of Sydney Foster, MD,as directed by  Sydney Foster, MD while in the presence of Sydney Foster, MD.  New patient visit   Patient: Sydney Vaughan   DOB: Nov 15, 1941   82 y.o. Female  MRN: [id number] Visit Date: 01/25/2023  Today's healthcare provider: Eulis Foster, MD   Chief Complaint  Patient presents with   Establish Care   Subjective    Sydney Vaughan is a 82 y.o. female who presents today as a new patient to establish care.  HPI    Encounter to Establish Care Patient presents to establish care  Introduced myself and my role as primary care physician  We reviewed patient's medical, surgical, and social history  Reviewed patient's current medications   Additional problems were discussed as detailed below  Atrial Fibrillation: follows with Dr. 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, states she often picks skin until it bleeds   Assisted Ambulance Device She uses a cane due to some issues with stability while standing  She has not worked with physical therapy in the past for her ambulation  She attributes this to her deconditioning  Patient has goal to start walking more in the park near her apartment   Social hx Patient reports that she is able to perform her ADLs and  IADLs independently  Patient's son often helps her around the home to keep her safe due to balance concerns and ambulation   lives with her son Sydney Vaughan) who helps her  She used to work at [employer] for 40 years    Past Medical History:  Diagnosis Date   AMS (altered mental status) 03/22/2022   Anxiety    Atrial fibrillation with rapid ventricular response ([place]) 03/2022   New diagnosis setting of UTI and TIA.-Next seen on monitor.   Depression    History of cardioembolic stroke    History of tobacco abuse    Hyperlipidemia with target LDL less than 70    Unfortunately, not willing to take statin.  Did not start statin that was ordered in the hospital.  She is very fearful of side effects.   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   TIA due to embolism ([place]) 03/2022   Past Surgical History:  Procedure Laterality Date   BREAST SURGERY     TOTAL MASTECTOMY     Left in 1986, Right in 1994   Family Status  Relation Name Status   Mother  Deceased   Father  (Not Specified)   Sister  (Not Specified)   Daughter  Deceased   Daughter  (Not Specified)   MGM  (Not Specified)   Family History  Problem Relation Age of Onset   Heart attack Mother    Cancer Father  colon   Stroke Father    Cancer Sister        ovarian   Cancer Daughter        ovarian   Cancer Daughter        Breast   Cancer Maternal Grandmother        breast   Social History   Socioeconomic History   Marital status: Widowed    Spouse name: Not on file   Number of children: Not on file   Years of education: Not on file   Highest education level: Not on file  Occupational History   Not on file  Tobacco Use   Smoking status: Former    Types: Cigarettes   Smokeless tobacco: Never  Substance and Sexual Activity   Alcohol use: Not Currently   Drug use: Never   Sexual activity: Not Currently  Other Topics Concern    Not on file  Social History Narrative   Very upset.  Daughter is dying of cancer.  She feels very guilty.   Cries a lot.   Also, recently moved out of her house to downsize after living there for 40 years.  Been quite emotional event.  Lots of social stress.   Social Determinants of Health   Financial Resource Strain: Not on file  Food Insecurity: Not on file  Transportation Needs: Not on file  Physical Activity: Not on file  Stress: Not on file  Social Connections: Not on file   Outpatient Medications Prior to Visit  Medication Sig   atenolol (TENORMIN) 50 MG tablet Take 1 tablet (50 mg total) by mouth 2 (two) times daily.   clonazePAM (KLONOPIN) 0.5 MG tablet Take 1 tablet by mouth daily as needed.   ELIQUIS 5 MG TABS tablet TAKE 1 TABLET BY MOUTH TWICE  DAILY   No facility-administered medications prior to visit.   No Known Allergies   There is no immunization history on file for this patient.  Health Maintenance  Topic Date Due   COVID-19 Vaccine (1) Never done   DTaP/Tdap/Td (1 - Tdap) Never done   Zoster Vaccines- Shingrix (1 of 2) Never done   Pneumonia Vaccine 18+ Years old (1 of 1 - PCV) Never done   DEXA SCAN  Never done   Medicare Annual Wellness (AWV)  11/03/2018   INFLUENZA VACCINE  Completed   HPV VACCINES  Aged Out    Patient Care Team: Sydney Foster, MD as PCP - General (Family Medicine) Leonie Man, MD as PCP - Cardiology (Cardiology)  Review of Systems  HENT:  Positive for dental problem.   Cardiovascular:  Positive for palpitations ("see's Cardiology").  Endocrine: Positive for polyuria.  Genitourinary:  Positive for urgency.  Skin:  Positive for rash.  Psychiatric/Behavioral:  The patient is nervous/anxious.        Objective    BP 129/87 (BP Location: Left Wrist, Patient Position: Sitting, Cuff Size: Normal)   Pulse 77   Resp 16   Ht '5\' 6"'$  (1.676 m)   Wt 234 lb (106.1 kg)   BMI 37.77 kg/m    Physical Exam Vitals  reviewed.  Constitutional:      General: She is not in acute distress.    Appearance: Normal appearance. She is not ill-appearing, toxic-appearing or diaphoretic.  Eyes:     Conjunctiva/sclera: Conjunctivae normal.  Neck:     Thyroid: No thyroid mass, thyromegaly or thyroid tenderness.     Vascular: No carotid bruit.  Cardiovascular:  Rate and Rhythm: Normal rate. Rhythm irregular.     Pulses: Normal pulses.     Heart sounds: Normal heart sounds. No murmur heard.    No friction rub. No gallop.  Pulmonary:     Effort: Pulmonary effort is normal. No respiratory distress.     Breath sounds: Normal breath sounds. No stridor. No wheezing, rhonchi or rales.  Abdominal:     General: Bowel sounds are normal. There is no distension.     Palpations: Abdomen is soft.     Tenderness: There is no abdominal tenderness.  Musculoskeletal:     Right lower leg: No edema.     Left lower leg: No edema.  Lymphadenopathy:     Cervical: No cervical adenopathy.  Skin:    Findings: No erythema or rash.     Comments: Patient has several areas of excoriations from picking skin and wounds that are healing   Neurological:     Mental Status: She is alert and oriented to person, place, and time.     Depression Screen    01/25/2023    2:31 PM  PHQ 2/9 Scores  PHQ - 2 Score 1  PHQ- 9 Score 5   No results found for any visits on 01/25/23.  Assessment & Plan      Problem List Items Addressed This Visit       Cardiovascular and Mediastinum   Atrial fibrillation with rapid ventricular response (HCC)    Chronic issue  Stable on atenolol '50mg'$  twice daily  Rate controlled  Continue current medication  Follows with Dr. Ellyn Hack for cardiology         Other   Anxiety state    Chronic  Has been stable on klonopin daily  Recommended starting Zoloft '25mg'$  daily with goal of decreasing benzo Patient given resources to establish with therapist        Relevant Medications   sertraline (ZOLOFT) 25  MG tablet   Depressive disorder    Chronic  Patient has lost two children to cancer and has not be able to communicate with her granddaughter  She is tearful throughout interview today  Recommended establishing with therapist as well as starting Zoloft '25mg'$  daily  She has been daily Klonopin, we discussed risks of this medication in patients over age 58, advised that I would aim for her to use this medication as needed for severe symptoms of anxiety and not a daily medication She voiced understanding and is agreeable to starting SSRI with goal of decreasing benzodiazepine        Relevant Medications   sertraline (ZOLOFT) 25 MG tablet   Encounter to establish care - Primary    Welcomed patient to [place] patient's medical history, medications, surgical and social history Discussed roles and expectations for primary care physician-patient relationship Recommended patient schedule annual preventative examination (AWV)        Skin picking habit    Chronic  Patient recommended to establish with therapist for talk therapy to help with developing coping skills for anxious habit  Patient was agreeable to this  Patient given resources to find therapist, see AVS       Ambulates with cane    Reports using a cane for assistance with balance while walking          Return in about 4 weeks (around 02/22/2023) for urinating, anxiety.       Goals Addressed  This Visit's Progress     Activity and Exercise Increased (pt-stated)        She would like to start walking in the park near their apartment         The entirety of the information documented in the History of Present Illness, Review of Systems and Physical Exam were personally obtained by me. Portions of this information were initially documented by Lyndel Pleasure, CMA . I, Sydney Foster, MD have reviewed the documentation above for thoroughness and  accuracy.    Sydney Foster, MD  [facility] [phone] (phone) [phone] (fax)  [place]

## 2023-01-25 ENCOUNTER — Ambulatory Visit (INDEPENDENT_AMBULATORY_CARE_PROVIDER_SITE_OTHER): Payer: Medicare Other | Admitting: Family Medicine

## 2023-01-25 ENCOUNTER — Encounter: Payer: Self-pay | Admitting: Family Medicine

## 2023-01-25 VITALS — BP 129/87 | HR 77 | Resp 16 | Ht 66.0 in | Wt 234.0 lb

## 2023-01-25 DIAGNOSIS — F32A Depression, unspecified: Secondary | ICD-10-CM

## 2023-01-25 DIAGNOSIS — F424 Excoriation (skin-picking) disorder: Secondary | ICD-10-CM | POA: Insufficient documentation

## 2023-01-25 DIAGNOSIS — Z9989 Dependence on other enabling machines and devices: Secondary | ICD-10-CM

## 2023-01-25 DIAGNOSIS — I4891 Unspecified atrial fibrillation: Secondary | ICD-10-CM | POA: Diagnosis not present

## 2023-01-25 DIAGNOSIS — Z7689 Persons encountering health services in other specified circumstances: Secondary | ICD-10-CM | POA: Insufficient documentation

## 2023-01-25 DIAGNOSIS — F411 Generalized anxiety disorder: Secondary | ICD-10-CM

## 2023-01-25 MED ORDER — SERTRALINE HCL 25 MG PO TABS: 25.0000 mg | ORAL_TABLET | Freq: Every day | ORAL | 3 refills | Status: DC

## 2023-01-25 NOTE — Patient Instructions (Addendum)
It was a pleasure to see you today!  Thank you for choosing [facility] for your primary care.   Annette Stable was seen for establish care, anxiety and depressed symptoms.   Our plans for today were: I have prescribed Zoloft '25mg'$  daily to help with your mood.  Please search [email or web address] to establish with a therapist to help with the mood symptoms we discussed.     You should return to our clinic in 4 weeks for anxiety follow up and urinary symptoms .   Best Wishes,   Dr. Quentin Cornwall

## 2023-01-27 ENCOUNTER — Encounter: Payer: Self-pay | Admitting: Family Medicine

## 2023-01-27 DIAGNOSIS — Z9989 Dependence on other enabling machines and devices: Secondary | ICD-10-CM | POA: Insufficient documentation

## 2023-01-27 NOTE — Assessment & Plan Note (Signed)
Chronic  Patient recommended to establish with therapist for talk therapy to help with developing coping skills for anxious habit  Patient was agreeable to this  Patient given resources to find therapist, see AVS

## 2023-01-27 NOTE — Assessment & Plan Note (Signed)
Chronic issue  Stable on atenolol '50mg'$  twice daily  Rate controlled  Continue current medication  Follows with Dr. Ellyn Hack for cardiology

## 2023-01-27 NOTE — Assessment & Plan Note (Signed)
Chronic  Patient has lost two children to cancer and has not be able to communicate with her granddaughter  She is tearful throughout interview today  Recommended establishing with therapist as well as starting Zoloft '25mg'$  daily  She has been daily Klonopin, we discussed risks of this medication in patients over age 81, advised that I would aim for her to use this medication as needed for severe symptoms of anxiety and not a daily medication She voiced understanding and is agreeable to starting SSRI with goal of decreasing benzodiazepine

## 2023-01-27 NOTE — Assessment & Plan Note (Signed)
Chronic  Has been stable on klonopin daily  Recommended starting Zoloft '25mg'$  daily with goal of decreasing benzo Patient given resources to establish with therapist

## 2023-01-27 NOTE — Assessment & Plan Note (Signed)
Reports using a cane for assistance with balance while walking

## 2023-01-27 NOTE — Assessment & Plan Note (Signed)
Welcomed patient to [facility]  Reviewed patient's medical history, medications, surgical and social history Discussed roles and expectations for primary care physician-patient relationship Recommended patient schedule annual preventative examination (AWV)

## 2023-02-20 NOTE — Progress Notes (Unsigned)
I,Joseline E Rosas,acting as a scribe for [employer], MD.,have documented all relevant documentation on the behalf of Eulis Foster, MD,as directed by  Eulis Foster, MD while in the presence of Eulis Foster, MD.   Established patient visit   Patient: Sydney Vaughan   DOB: [birth year]   82 y.o. Female  MRN: [id number] Visit Date: 02/22/2023  Today's healthcare provider: Eulis Foster, MD   Chief Complaint  Patient presents with   Follow-up   Subjective    HPI   Anxiety, Follow-up  She was last seen for anxiety 3 weeks ago. Changes made at last visit include started Zoloft 25mg  daily, recommended PRN 0.5mg  klonopin. She states that she has been having decrease appetite which has decreased her oral intake    She reports excellent compliance with treatment. She reports excellent tolerance of treatment. She is not having side effects. Reports at the beginning her heart raise a little and kept her up but now she is not having this symptoms. Reports the heart palpitation is actually chronic that comes and goes specially when she doesn't sleep well.  She feels her anxiety is moderate and Improved since last visit.  Symptoms: No chest pain No difficulty concentrating  No dizziness Yes fatigue  No feelings of losing control Yes insomnia  No irritable Yes palpitations-reports this is chronic  Yes panic attacks No racing thoughts  Yes shortness of breath No sweating  No tremors/shakes    GAD-7 Results    02/22/2023    1:43 PM  GAD-7 Generalized Anxiety Disorder Screening Tool  1. Feeling Nervous, Anxious, or on Edge 1  2. Not Being Able to Stop or Control Worrying 1  3. Worrying Too Much About Different Things 1  4. Trouble Relaxing 1  5. Being So Restless it's Hard To Sit Still 0  6. Becoming Easily Annoyed or Irritable 1  7. Feeling Afraid As If Something Awful Might Happen 0  Total GAD-7 Score 5  Difficulty At  Work, Home, or Getting  Along With Others? Not difficult at all    PHQ-9 Scores    02/22/2023    1:33 PM 01/25/2023    2:31 PM  PHQ9 SCORE ONLY  PHQ-9 Total Score 3 5    SOB with Exertion  States that she recently had her atenolol increased to 50mg  twice daily from 25mg  twice daily  States that has been feeling more tired, has been having SOB with exertion  Reports that she has been adherent to eliquis regimen  She denies noticing abnormal bruising   --------------------------------------------------------------------------------------------------- Urination Problem -patient reports that overall is getting better. The urgency is not as much.   Medications: Outpatient Medications Prior to Visit  Medication Sig   atenolol (TENORMIN) 50 MG tablet Take 1 tablet (50 mg total) by mouth 2 (two) times daily.   clonazePAM (KLONOPIN) 0.5 MG tablet Take 1 tablet by mouth daily as needed.   ELIQUIS 5 MG TABS tablet TAKE 1 TABLET BY MOUTH TWICE  DAILY   [DISCONTINUED] sertraline (ZOLOFT) 25 MG tablet Take 1 tablet (25 mg total) by mouth daily.   No facility-administered medications prior to visit.    Review of Systems     Objective    BP (!) 95/56 (BP Location: Right Arm, Patient Position: Sitting, Cuff Size: Normal)   Pulse (!) 52   Temp 97.8 F (36.6 C) (Oral)   Resp 16   Wt 236 lb 9.6 oz (107.3 kg)   BMI 38.19 kg/m  Physical Exam Vitals (Hypotensive) reviewed.  Constitutional:      General: She is not in acute distress.    Appearance: Normal appearance. She is normal weight. She is not ill-appearing, toxic-appearing or diaphoretic.     Comments: Well groomed, calmly sitting in exam rooom  Cardiovascular:     Rate and Rhythm: Normal rate. Rhythm irregular.  Pulmonary:     Effort: Pulmonary effort is normal. No respiratory distress.  Musculoskeletal:     Right lower leg: No edema.     Left lower leg: No edema.  Skin:    Comments: Healing areas of excoriations on  bilateral lower extremities  Neurological:     Mental Status: She is alert.  Psychiatric:        Attention and Perception: Attention and perception normal. She is attentive. She does not perceive auditory or visual hallucinations.        Mood and Affect: Mood and affect normal. Mood is not anxious or depressed. Affect is not labile, flat or tearful.        Speech: Speech normal.        Behavior: Behavior normal. Behavior is cooperative.        Thought Content: Thought content normal. Thought content is not paranoid or delusional. Thought content does not include homicidal or suicidal ideation. Thought content does not include homicidal or suicidal plan.        Judgment: Judgment normal.     No results found for any visits on 02/22/23.  Assessment & Plan     Problem List Items Addressed This Visit       Cardiovascular and Mediastinum   Hypotension due to hypovolemia    Recent onset Patient with blood pressure 95/56 today in clinic States that she has decreased her oral intake due to decreased appetite States that she has not been drinking as much fluid as she normally does Encourage patient to increase her oral intake of fluids especially Will check for etiologies of shortness of breath with exertion as noted below to see if this could be contributed to her hypotension Of note, patient recently had atenolol dose increased from 25 mg twice daily to 50 mg twice daily        Other   Anxiety state    Chronic Symptoms stable, improved since starting Zoloft Recommended the patient try to increase Zoloft to 50 mg daily, recommended that she return to 25 mg daily if she begins to feel adverse effects after increasing the dose Patient has used benzodiazepine 3 times since our last visit Overall anxiety and depression symptoms have decreased      Relevant Medications   sertraline (ZOLOFT) 25 MG tablet   Depressive disorder    Chronic Improved Increase Zoloft to 50 mg daily Will  follow-up with patient in 3 weeks      Relevant Medications   sertraline (ZOLOFT) 25 MG tablet   SOBOE (shortness of breath on exertion) - Primary    New problem Has noticed in the last few days that she has more exertional dyspnea Patient has established with cardiology This could be potentially related to atrial fibrillation, new onset heart failure or renal/hepatic abnormalities Will check CMP today we will also check CBC to assess for anemia, will check ferritin to assess for any iron deficiency we will also check BNP Chest x-ray ordered      Relevant Orders   Comprehensive metabolic panel   CBC   Ferritin   B Nat Peptide   DG  Chest 2 View     Return in about 3 weeks (around 03/15/2023) for SOB and anxiety.        The entirety of the information documented in the History of Present Illness, Review of Systems and Physical Exam were personally obtained by me. Portions of this information were initially documented by Lyndel Pleasure, CMA . I, Eulis Foster, MD have reviewed the documentation above for thoroughness and accuracy.      Eulis Foster, MD  [facility] [phone] (phone) [phone] (fax)  [place]

## 2023-02-22 ENCOUNTER — Telehealth: Payer: Self-pay

## 2023-02-22 ENCOUNTER — Ambulatory Visit (INDEPENDENT_AMBULATORY_CARE_PROVIDER_SITE_OTHER): Payer: Medicare Other | Admitting: Family Medicine

## 2023-02-22 ENCOUNTER — Encounter: Payer: Self-pay | Admitting: Family Medicine

## 2023-02-22 VITALS — BP 95/56 | HR 52 | Temp 97.8°F | Resp 16 | Wt 236.6 lb

## 2023-02-22 DIAGNOSIS — E861 Hypovolemia: Secondary | ICD-10-CM | POA: Insufficient documentation

## 2023-02-22 DIAGNOSIS — F411 Generalized anxiety disorder: Secondary | ICD-10-CM

## 2023-02-22 DIAGNOSIS — R0602 Shortness of breath: Secondary | ICD-10-CM | POA: Diagnosis not present

## 2023-02-22 DIAGNOSIS — I4891 Unspecified atrial fibrillation: Secondary | ICD-10-CM | POA: Diagnosis not present

## 2023-02-22 DIAGNOSIS — F32A Depression, unspecified: Secondary | ICD-10-CM

## 2023-02-22 MED ORDER — SERTRALINE HCL 25 MG PO TABS: 50.0000 mg | ORAL_TABLET | Freq: Every day | ORAL | 1 refills | Status: DC

## 2023-02-22 NOTE — Patient Instructions (Addendum)
It was a pleasure to see you today!  Thank you for choosing [facility] for your primary care.   Sydney Vaughan was seen for shortness of breath and anxiety/depression.   Our plans for today were: Please start taking the 50mg  of Zoloft for 2 weeks, if you have adverse effects then you can return to 25mg  daily For your shortness of breath, please get an xray at the location listed below.  We will collect labs today and follow up with you once the results are available.    Please report to [facility] located at:  [place], [place]  You do not need an appointment to have xrays completed.   Our office will follow up with  results once available.    To keep you healthy, please keep in mind the following health maintenance items that you are due for:   Tetanus vaccine  Shingrix Vaccine  Pneumonia Vaccine  Bone density scan  Annual Wellness Visit    You should return to our clinic in 3 weeks for breathing and anxiety.   Best Wishes,   Dr. Quentin Cornwall

## 2023-02-22 NOTE — Telephone Encounter (Signed)
Copied from [place] [id number]. Topic: Appointment Scheduling - Scheduling Inquiry for Clinic >> Feb 22, 2023  9:58 AM Marcellus Scott wrote: Reason for CRM: Pt is requesting that the upcoming appointment for today be a phone visit instead.  Please advise.

## 2023-02-22 NOTE — Assessment & Plan Note (Signed)
Recent onset Patient with blood pressure 95/56 today in clinic States that she has decreased her oral intake due to decreased appetite States that she has not been drinking as much fluid as she normally does Encourage patient to increase her oral intake of fluids especially Will check for etiologies of shortness of breath with exertion as noted below to see if this could be contributed to her hypotension Of note, patient recently had atenolol dose increased from 25 mg twice daily to 50 mg twice daily

## 2023-02-22 NOTE — Assessment & Plan Note (Signed)
Chronic Improved Increase Zoloft to 50 mg daily Will follow-up with patient in 3 weeks

## 2023-02-22 NOTE — Assessment & Plan Note (Signed)
Chronic Symptoms stable, improved since starting Zoloft Recommended the patient try to increase Zoloft to 50 mg daily, recommended that she return to 25 mg daily if she begins to feel adverse effects after increasing the dose Patient has used benzodiazepine 3 times since our last visit Overall anxiety and depression symptoms have decreased

## 2023-02-22 NOTE — Assessment & Plan Note (Signed)
New problem Has noticed in the last few days that she has more exertional dyspnea Patient has established with cardiology This could be potentially related to atrial fibrillation, new onset heart failure or renal/hepatic abnormalities Will check CMP today we will also check CBC to assess for anemia, will check ferritin to assess for any iron deficiency we will also check BNP Chest x-ray ordered

## 2023-02-23 ENCOUNTER — Ambulatory Visit: Payer: Self-pay | Admitting: *Deleted

## 2023-02-23 ENCOUNTER — Telehealth: Payer: Self-pay

## 2023-02-23 LAB — CBC
Hematocrit: 43.8 % (ref 34.0–46.6)
Hemoglobin: 14.7 g/dL (ref 11.1–15.9)
MCH: 32 pg (ref 26.6–33.0)
MCHC: 33.6 g/dL (ref 31.5–35.7)
MCV: 95 fL (ref 79–97)
Platelets: 192 10*3/uL (ref 150–450)
RBC: 4.6 x10E6/uL (ref 3.77–5.28)
RDW: 13 % (ref 11.7–15.4)
WBC: 9.5 10*3/uL (ref 3.4–10.8)

## 2023-02-23 LAB — COMPREHENSIVE METABOLIC PANEL
ALT: 6 IU/L (ref 0–32)
AST: 12 IU/L (ref 0–40)
Albumin/Globulin Ratio: 1.3 (ref 1.2–2.2)
Albumin: 4.1 g/dL (ref 3.7–4.7)
Alkaline Phosphatase: 102 IU/L (ref 44–121)
BUN/Creatinine Ratio: 19 (ref 12–28)
BUN: 21 mg/dL (ref 8–27)
Bilirubin Total: 0.8 mg/dL (ref 0.0–1.2)
CO2: 23 mmol/L (ref 20–29)
Calcium: 9.8 mg/dL (ref 8.7–10.3)
Chloride: 103 mmol/L (ref 96–106)
Creatinine, Ser: 1.09 mg/dL — ABNORMAL HIGH (ref 0.57–1.00)
Globulin, Total: 3.2 g/dL (ref 1.5–4.5)
Glucose: 104 mg/dL — ABNORMAL HIGH (ref 70–99)
Potassium: 4.4 mmol/L (ref 3.5–5.2)
Sodium: 141 mmol/L (ref 134–144)
Total Protein: 7.3 g/dL (ref 6.0–8.5)
eGFR: 51 mL/min/{1.73_m2} — ABNORMAL LOW (ref >59)

## 2023-02-23 LAB — BRAIN NATRIURETIC PEPTIDE: BNP: 204.6 pg/mL — ABNORMAL HIGH (ref 0.0–100.0)

## 2023-02-23 LAB — FERRITIN: Ferritin: 171 ng/mL — ABNORMAL HIGH (ref 15–150)

## 2023-02-23 NOTE — Telephone Encounter (Signed)
Please have patient follow up for urine sample. If her son can pick up urine sample cup from the office to take to her and then return her sample   Eulis Foster, MD  [facility]

## 2023-02-23 NOTE — Telephone Encounter (Signed)
Reason for Disposition  Taking Coumadin (warfarin) or other strong blood thinner, or known bleeding disorder (e.g., thrombocytopenia)  Answer Assessment - Initial Assessment Questions 1. COLOR of URINE: "Describe the color of the urine."  (e.g., tea-colored, pink, red, bloody) "Do you have blood clots in your urine?" (e.g., none, pea, grape, small coin)     When wiped saw blood 2. ONSET: "When did the bleeding start?"      today 3. EPISODES: "How many times has there been blood in the urine?" or "How many times today?"     Once- 1 hour ago 4. PAIN with URINATION: "Is there any pain with passing your urine?" If Yes, ask: "How bad is the pain?"  (Scale 1-10; or mild, moderate, severe)    - MILD: Complains slightly about urination hurting.    - MODERATE: Interferes with normal activities.      - SEVERE: Excruciating, unwilling or unable to urinate because of the pain.      No 5. FEVER: "Do you have a fever?" If Yes, ask: "What is your temperature, how was it measured, and when did it start?"     no 6. ASSOCIATED SYMPTOMS: "Are you passing urine more frequently than usual?"     no 7. OTHER SYMPTOMS: "Do you have any other symptoms?" (e.g., back/flank pain, abdomen pain, vomiting)     Feels fatigued  Protocols used: Urine - Blood In-A-AH

## 2023-02-23 NOTE — Telephone Encounter (Signed)
  Chief Complaint: blood in urine Symptoms: patient is seeing blood when wipe- patient is on blood thinner- she states she had UTI when started Eliquis- she normally does not have UTI Frequency: stated about 1 hour ago Pertinent Negatives: Patient denies urinary pain Disposition: [] ED /[] Urgent Care (no appt availability in office) / [] Appointment(In office/virtual)/ []  [facility]/ [] Home Care/ [x] Refused Recommended Disposition /[] [facility]/ [x]  Follow-up with PCP Additional Notes: Patient was just in office yesterday and states PCP asked her about blood in urine- at that time she did not have- but now she is seeing blood- patient does not want to come in if she doesn't have to- she states her son can bring sample if needed. Please let her know if she can be treated or if she needs to come to the office

## 2023-02-23 NOTE — Telephone Encounter (Signed)
Copied from [facility] [id number]. Topic: General - Other >> Feb 23, 2023  1:02 PM Sydney Vaughan wrote: Reason for CRM: The patient would like to be contacted by a member of staff when possible to further address their labs which have been recently reviewed   Please contact further when possible

## 2023-02-23 NOTE — Telephone Encounter (Signed)
Patient was given lab results by [facility] Nurse and we are still waiting on other labs. Once we get those results will contact patient again.

## 2023-02-24 ENCOUNTER — Telehealth: Payer: Self-pay

## 2023-02-24 NOTE — Telephone Encounter (Signed)
Left detailed message per DPR. CRM created. Ok for [facility] to advise

## 2023-02-24 NOTE — Telephone Encounter (Signed)
Noted. Duplicate encounter

## 2023-02-24 NOTE — Telephone Encounter (Signed)
Copied from CRM #[id number]. Topic: General - Other >> Feb 24, 2023  3:35 PM Carrielelia G wrote: Reason for CRM:  Refer to 02/23/2023  nurse triage message.  Patient will send her son today or monday for a specimen cup 

## 2023-02-24 NOTE — Telephone Encounter (Signed)
Copied from CRM [id number]. Topic: General - Other >> Feb 24, 2023  3:35 PM Carrielelia G wrote: Reason for CRM:  Refer to 02/23/2023  nurse triage message.  Patient will send her son today or monday for a specimen cup

## 2023-02-28 NOTE — Telephone Encounter (Signed)
Please Review

## 2023-02-28 NOTE — Telephone Encounter (Signed)
Pt stated her son will not be picking up the specimen cup; she stated she doesn't think she needs it.  Please advise.

## 2023-03-06 ENCOUNTER — Ambulatory Visit: Payer: Self-pay

## 2023-03-06 DIAGNOSIS — F32A Depression, unspecified: Secondary | ICD-10-CM

## 2023-03-06 DIAGNOSIS — F411 Generalized anxiety disorder: Secondary | ICD-10-CM

## 2023-03-06 NOTE — Telephone Encounter (Signed)
  Chief Complaint: Medication reaction - to increased dose of Zoloft. Not sleeping, palpitations. Symptoms: above Frequency: Yesterday Pertinent Negatives: Patient denies  Disposition: [] ED /[] Urgent Care (no appt availability in office) / [] Appointment(In office/virtual)/ []  [facility]/ [] Home Care/ [x] Refused Recommended Disposition /[] [place] [facility]/ []  Follow-up with PCP Additional Notes: PT states that she was told to increase her Zoloft to 50 mg daily which she did she had been taking the increased dose since her last appt. Burgess Estelle pt states that she had palpitations, started not sleeping and was having a crazy day, which she believes is from the increased dose of Zoloft.  PT reduced her dose to 25 mg today and is feeling better. She called to let provider know. Pt does not want to be seen. She will call back if she has further issues.   Reason for Disposition  Age > 60 years  (Exception: Brief heartbeat symptoms that went away and now feels well.)  Answer Assessment - Initial Assessment Questions 1. DESCRIPTION: "Please describe your heart rate or heartbeat that you are having" (e.g., fast/slow, regular/irregular, skipped or extra beats, "palpitations")     palpitations 2. ONSET: "When did it start?" (Minutes, hours or days)      Yesterday 3. DURATION: "How long does it last" (e.g., seconds, minutes, hours)     Last the day 4. PATTERN "Does it come and go, or has it been constant since it started?"  "Does it get worse with exertion?"   "Are you feeling it now?"      5. TAP: "Using your hand, can you tap out what you are feeling on a chair or table in front of you, so that I can hear?" (Note: not all patients can do this)        6. HEART RATE: "Can you tell me your heart rate?" "How many beats in 15 seconds?"  (Note: not all patients can do this)        7. RECURRENT SYMPTOM: "Have you ever had this before?" If Yes, ask: "When was the last time?" and "What  happened that time?"      no 8. CAUSE: "What do you think is causing the palpitations?"     Zoloft increase 9. CARDIAC HISTORY: "Do you have any history of heart disease?" (e.g., heart attack, angina, bypass surgery, angioplasty, arrhythmia)      no 10. OTHER SYMPTOMS: "Do you have any other symptoms?" (e.g., dizziness, chest pain, sweating, difficulty breathing)       Palpitations, Can't sleep, can't shut down brain  Protocols used: Heart Rate and Heartbeat Questions-A-AH

## 2023-03-07 ENCOUNTER — Other Ambulatory Visit: Payer: Self-pay | Admitting: Family Medicine

## 2023-03-07 DIAGNOSIS — F411 Generalized anxiety disorder: Secondary | ICD-10-CM

## 2023-03-07 MED ORDER — CLONAZEPAM 0.5 MG PO TABS: 0.5000 mg | ORAL_TABLET | Freq: Every day | ORAL | 0 refills | Status: DC | PRN

## 2023-03-07 MED ORDER — SERTRALINE HCL 25 MG PO TABS: 25.0000 mg | ORAL_TABLET | Freq: Every day | ORAL | 1 refills | Status: DC

## 2023-03-07 NOTE — Addendum Note (Signed)
Addended by: Bing Neighbors on: 03/07/2023 11:09 AM   Modules accepted: Orders

## 2023-03-07 NOTE — Telephone Encounter (Signed)
Reviewed encounter notes.   Agree with patient continuing  dose of Zoloft instead of  daily. Medication list updated.   Will follow up during scheduled visit on 03/16/23   Ronnald Ramp, MD  [facility]

## 2023-03-09 ENCOUNTER — Ambulatory Visit
Admission: RE | Admit: 2023-03-09 | Discharge: 2023-03-09 | Disposition: A | Discharge status: Home / Self Care | Payer: Medicare Other | Attending: Family Medicine | Admitting: Family Medicine

## 2023-03-09 ENCOUNTER — Ambulatory Visit
Admission: RE | Admit: 2023-03-09 | Discharge: 2023-03-09 | Disposition: A | Discharge status: Home / Self Care | Payer: Medicare Other | Source: Ambulatory Visit | Attending: Family Medicine | Admitting: Family Medicine

## 2023-03-09 DIAGNOSIS — R0602 Shortness of breath: Secondary | ICD-10-CM | POA: Diagnosis not present

## 2023-03-09 DIAGNOSIS — M4802 Spinal stenosis, cervical region: Secondary | ICD-10-CM | POA: Diagnosis not present

## 2023-03-09 DIAGNOSIS — Z0183 Encounter for blood typing: Secondary | ICD-10-CM | POA: Diagnosis not present

## 2023-03-15 ENCOUNTER — Other Ambulatory Visit: Payer: Self-pay

## 2023-03-15 DIAGNOSIS — I10 Essential (primary) hypertension: Secondary | ICD-10-CM

## 2023-03-15 MED ORDER — ATENOLOL 50 MG PO TABS: 50.0000 mg | ORAL_TABLET | Freq: Two times a day (BID) | ORAL | 1 refills | Status: DC

## 2023-03-15 NOTE — Telephone Encounter (Signed)
Last visit 12/30/22--Follow-up with Dr. Herbie Baltimore or APP in 6 months.   Next visit not scheduled

## 2023-03-16 ENCOUNTER — Ambulatory Visit: Payer: Medicare Other | Admitting: Family Medicine

## 2023-03-22 ENCOUNTER — Ambulatory Visit (INDEPENDENT_AMBULATORY_CARE_PROVIDER_SITE_OTHER): Payer: Medicare Other | Admitting: Family Medicine

## 2023-03-22 ENCOUNTER — Encounter: Payer: Self-pay | Admitting: Family Medicine

## 2023-03-22 VITALS — BP 100/69 | HR 72 | Temp 98.2°F | Resp 12 | Wt 239.7 lb

## 2023-03-22 DIAGNOSIS — R0602 Shortness of breath: Secondary | ICD-10-CM | POA: Diagnosis not present

## 2023-03-22 DIAGNOSIS — F411 Generalized anxiety disorder: Secondary | ICD-10-CM

## 2023-03-22 NOTE — Progress Notes (Signed)
I,Sulibeya S Dimas,acting as a Neurosurgeon for [employer], MD.,have documented all relevant documentation on the behalf of Ronnald Ramp, MD,as directed by  Ronnald Ramp, MD while in the presence of Ronnald Ramp, MD.     Established patient visit   Patient: Sydney Vaughan   DOB: August 26, 1941   82 y.o. Female  MRN: [id number] Visit Date: 03/22/2023  Today's healthcare provider: Ronnald Ramp, MD   Chief Complaint  Patient presents with   Follow-up   Subjective    HPI  Anxiety, Follow-up  She was last seen for anxiety 1 months ago. Changes made at last visit include increase Zoloft to 50 mg daily, recommended that she return to 25 mg daily if she begins to feel adverse effects after increasing the dose.   She reports good compliance with treatment.  Patient taking Zoloft 25 mg daily. Was not able to tolerate 50 mg due to reports of  insomnia and palpitations.  She states that she has been taking the clonazepam twice to help with sleep in the last month, and is working on cutting back She reports good tolerance of treatment. She is not having side effects.   She feels her anxiety is mild and Improved since last visit.  GAD-7 Results    03/22/2023    3:53 PM 02/22/2023    1:43 PM  GAD-7 Generalized Anxiety Disorder Screening Tool  1. Feeling Nervous, Anxious, or on Edge 0 1  2. Not Being Able to Stop or Control Worrying 1 1  3. Worrying Too Much About Different Things 2 1  4. Trouble Relaxing 0 1  5. Being So Restless it's Hard To Sit Still 0 0  6. Becoming Easily Annoyed or Irritable 0 1  7. Feeling Afraid As If Something Awful Might Happen 0 0  Total GAD-7 Score 3 5  Difficulty At Work, Home, or Getting  Along With Others? Not difficult at all Not difficult at all    PHQ-9 Scores    03/22/2023    3:55 PM 02/22/2023    1:33 PM 01/25/2023    2:31 PM  PHQ9 SCORE ONLY  PHQ-9 Total Score 5 3 5     Follow up for SOBOE  (shortness of breath on exertion)  The patient was last seen for this 1 months ago. Changes made at last visit include none.  She feels that condition is Improved. She is not having side effects.     Patient states that she is working on walking more with her son in the park to help improve her mobility  She states that she does not move around as well as she used to but she does not want to establish with physical therapy at this time  -----------------------------------------------------------------------------------------  Medications: Outpatient Medications Prior to Visit  Medication Sig   atenolol (TENORMIN) 50 MG tablet Take 1 tablet (50 mg total) by mouth 2 (two) times daily. Due for follow up appointment in 06/2023   clonazePAM (KLONOPIN) 0.5 MG tablet Take 1 tablet (0.5 mg total) by mouth daily as needed for anxiety.   ELIQUIS 5 MG TABS tablet TAKE 1 TABLET BY MOUTH TWICE  DAILY   sertraline (ZOLOFT) 25 MG tablet Take 1 tablet (25 mg total) by mouth daily.   No facility-administered medications prior to visit.    Review of Systems  Constitutional:  Negative for appetite change and fatigue.  Respiratory:  Negative for chest tightness and shortness of breath.   Psychiatric/Behavioral:  Negative for decreased concentration and sleep  disturbance. The patient is nervous/anxious.        Objective    BP 100/69 (BP Location: Left Arm, Patient Position: Sitting, Cuff Size: Large)   Pulse 72   Temp 98.2 F (36.8 C) (Temporal)   Resp 12   Wt 239 lb 11.2 oz (108.7 kg)   SpO2 97%   BMI 38.69 kg/m    Physical Exam Vitals reviewed.  Constitutional:      General: She is not in acute distress.    Appearance: Normal appearance. She is normal weight. She is not ill-appearing, toxic-appearing or diaphoretic.     Comments: Well groomed, calmly sitting in exam rooom  Eyes:     Conjunctiva/sclera: Conjunctivae normal.  Cardiovascular:     Rate and Rhythm: Normal rate. Rhythm  irregular.     Pulses: Normal pulses.     Heart sounds:     No friction rub. No gallop.  Pulmonary:     Effort: Pulmonary effort is normal. No respiratory distress.     Breath sounds: Normal breath sounds. No stridor. No wheezing, rhonchi or rales.  Abdominal:     General: Bowel sounds are normal. There is no distension.     Palpations: Abdomen is soft.     Tenderness: There is no abdominal tenderness.  Musculoskeletal:     Right lower leg: No edema.     Left lower leg: No edema.  Neurological:     Mental Status: She is alert and oriented to person, place, and time.  Psychiatric:        Attention and Perception: Attention and perception normal. She is attentive. She does not perceive auditory or visual hallucinations.        Mood and Affect: Mood and affect normal.        Speech: Speech normal.        Behavior: Behavior normal. Behavior is cooperative.        Thought Content: Thought content normal. Thought content is not paranoid or delusional. Thought content does not include homicidal or suicidal ideation. Thought content does not include homicidal or suicidal plan.        Judgment: Judgment normal.       No results found for any visits on 03/22/23.  Assessment & Plan     Problem List Items Addressed This Visit       Other   Anxiety state - Primary    Chronic  Stable  Mood is improved on Zoloft 25mg  daily  She has used klonopin 0.5mg  more sparingly in the last month  Continue current regimen       SOBOE (shortness of breath on exertion)    Patient reports that this is resolved  No further intervention at this time  Advised patient to follow up PRN         Return in about 6 months (around 09/22/2023) for chronic conditions.      The entirety of the information documented in the History of Present Illness, Review of Systems and Physical Exam were personally obtained by me. Portions of this information were initially documented by [facility]. I, Ronnald Ramp, MD have reviewed the documentation above for thoroughness and accuracy.   Ronnald Ramp, MD  [facility] [phone] (phone) [phone] (fax)  [facility]

## 2023-03-22 NOTE — Assessment & Plan Note (Signed)
Chronic  Stable  Mood is improved on Zoloft 25mg  daily  She has used klonopin 0.5mg  more sparingly in the last month  Continue current regimen

## 2023-03-22 NOTE — Assessment & Plan Note (Signed)
Patient reports that this is resolved  No further intervention at this time  Advised patient to follow up PRN

## 2023-04-25 IMAGING — CR DG PELVIS 1-2V
1 series · 1 of 1 positions shown · non-contrast
Comparison: None.

CLINICAL DATA: Intermittent weakness for 1 week, screening for
metal prior to MRI

EXAM:
PELVIS - 1-2 VIEW

[pelvis ap]
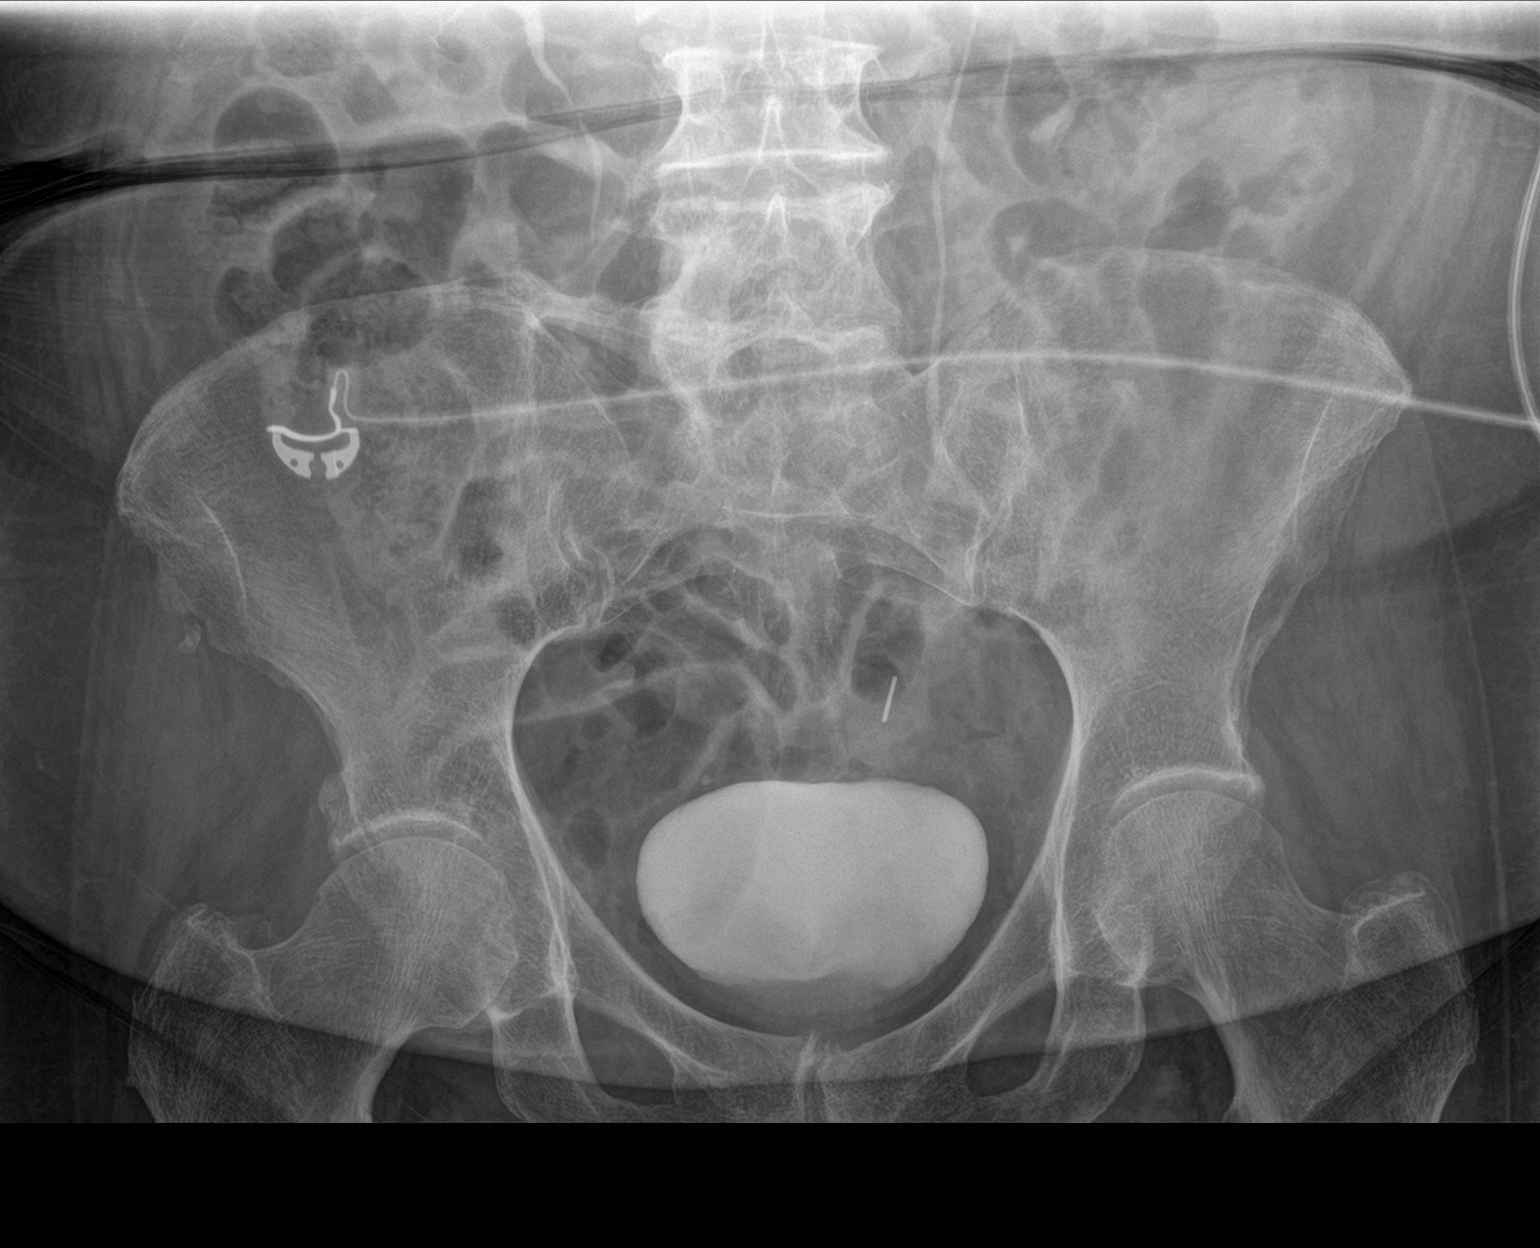

[1 of 1 positions shown; findings below may reference images not displayed]

FINDINGS: Supine frontal view of the pelvis demonstrates a 1.2 cm linear
metallic surgical clip projecting over the left hemipelvis. Excreted
contrast within the ureters and bladder. No other radiopaque foreign
bodies. Bowel gas pattern is unremarkable. No acute fracture.
IMPRESSION: 1. Surgical clip overlying left hemipelvis. No other radiopaque
foreign body.

## 2023-05-05 ENCOUNTER — Telehealth: Payer: Self-pay

## 2023-05-05 NOTE — Telephone Encounter (Signed)
Did patient provide any details in regards to symptoms?   Recommend scheduling visit to discuss further

## 2023-05-05 NOTE — Telephone Encounter (Signed)
Patient scheduled for Monday afternoon with Dr.Fisher at 4:00 pm. Patient verbalized understanding

## 2023-05-05 NOTE — Telephone Encounter (Signed)
Patient reports she is having urinary itching and burning and blood in urine one day. Symptoms started last week. No bleeding since then.  Reports she doesn't understand why she can't just have specimen provided to office because she did it before. States her son is on the way to pick up cup. Please advise

## 2023-05-05 NOTE — Telephone Encounter (Signed)
Please schedule with next available appt for evaluation due to updated policy for collecting urine specimen

## 2023-05-05 NOTE — Telephone Encounter (Signed)
Copied from CRM [id number]. Topic: General - Other >> May 05, 2023 12:27 PM Clide Dales wrote: Patient called to advise that her son will be coming by to pick up a specimen cup for patient.

## 2023-05-08 ENCOUNTER — Ambulatory Visit: Payer: Medicare Other | Admitting: Family Medicine

## 2023-05-17 ENCOUNTER — Other Ambulatory Visit: Payer: Self-pay | Admitting: Family Medicine

## 2023-05-17 ENCOUNTER — Telehealth: Payer: Self-pay | Admitting: Family Medicine

## 2023-05-17 DIAGNOSIS — F411 Generalized anxiety disorder: Secondary | ICD-10-CM

## 2023-05-17 MED ORDER — CLONAZEPAM 0.5 MG PO TABS: 0.5000 mg | ORAL_TABLET | Freq: Every day | ORAL | 0 refills | Status: DC | PRN

## 2023-05-17 NOTE — Telephone Encounter (Signed)
[facility] pharmacy faxed refill request for the following medications:   clonazePAM (KLONOPIN) 0.5 MG tablet     Please advise

## 2023-05-22 ENCOUNTER — Telehealth: Payer: Self-pay | Admitting: Family Medicine

## 2023-05-22 NOTE — Telephone Encounter (Signed)
Reviewed. Will discuss at her next appointment.

## 2023-05-22 NOTE — Telephone Encounter (Signed)
Please advise that I do not typically prescribe more than 1 month supply at a time for controlled substances. We can discuss further at her next follow up appt   Ronnald Ramp, MD  [facility]

## 2023-05-22 NOTE — Telephone Encounter (Signed)
Medication Refill - Medication: clonazePAM (KLONOPIN) 0.5 MG tablet   Pt stated [facility] stated she needs a 90-day Rx; that way, there would be no cost to her.   Has the patient contacted their pharmacy? Yes.    (Agent: If yes, when and what did the pharmacy advise?)  Preferred Pharmacy (with phone number or street name):  [facility] - [place], [place] - [address]  [address] [place] [address]  Phone: [phone] Fax: [phone]  Hours: Not open 24 hours   Has the patient been seen for an appointment in the last year OR does the patient have an upcoming appointment? Yes.    Agent: Please be advised that RX refills may take up to 3 business days. We ask that you follow-up with your pharmacy.

## 2023-05-26 ENCOUNTER — Other Ambulatory Visit: Payer: Self-pay | Admitting: Cardiology

## 2023-05-26 DIAGNOSIS — I10 Essential (primary) hypertension: Secondary | ICD-10-CM

## 2023-05-31 ENCOUNTER — Ambulatory Visit: Payer: Medicare Other

## 2023-06-21 ENCOUNTER — Ambulatory Visit (INDEPENDENT_AMBULATORY_CARE_PROVIDER_SITE_OTHER): Payer: Medicare Other

## 2023-06-21 VITALS — Ht 66.0 in | Wt 239.0 lb

## 2023-06-21 DIAGNOSIS — Z0000 Encounter for general adult medical examination without abnormal findings: Secondary | ICD-10-CM | POA: Diagnosis not present

## 2023-06-21 NOTE — Progress Notes (Signed)
Subjective:   Sydney Vaughan is a 82 y.o. female who presents for an Initial Medicare Annual Wellness Visit.  Visit Complete: Virtual  I connected with  York Pellant on 06/21/23 by a audio enabled telemedicine application and verified that I am speaking with the correct person using two identifiers.  Patient Location: Home  Provider Location: Office/Clinic  I discussed the limitations of evaluation and management by telemedicine. The patient expressed understanding and agreed to proceed.  Vital Signs: Unable to obtain new vitals due to this being a telehealth visit.  Patient Medicare AWV questionnaire was completed by the patient on (not done); I have confirmed that all information answered by patient is correct and no changes since this date.  Review of Systems    Cardiac Risk Factors include: advanced age (>42men, >43 women);dyslipidemia;obesity (BMI >30kg/m2);sedentary lifestyle    Objective:    Today's Vitals   06/21/23 1152  Weight: 239 lb (108.4 kg)  Height: 5\' 6"  (1.676 m)   Body mass index is 38.58 kg/m.     06/21/2023   12:10 PM 03/22/2022    2:00 AM  Advanced Directives  Does Patient Have a Medical Advance Directive? Yes No  Type of Advance Directive Healthcare Power of Attorney   Would patient like information on creating a medical advance directive?  No - Patient declined    Current Medications (verified) Outpatient Encounter Medications as of 06/21/2023  Medication Sig   atenolol (TENORMIN) 50 MG tablet TAKE 1 TABLET BY MOUTH TWICE  DAILY   clonazePAM (KLONOPIN) 0.5 MG tablet Take 1 tablet (0.5 mg total) by mouth daily as needed for anxiety.   ELIQUIS 5 MG TABS tablet TAKE 1 TABLET BY MOUTH TWICE  DAILY   sertraline (ZOLOFT) 25 MG tablet Take 1 tablet (25 mg total) by mouth daily.   No facility-administered encounter medications on file as of 06/21/2023.    Allergies (verified) Patient has no known allergies.   History: Past Medical History:   Diagnosis Date   AMS (altered mental status) 03/22/2022   Anxiety    Atrial fibrillation with rapid ventricular response (HCC) 03/2022   New diagnosis setting of UTI and TIA.-Next seen on monitor.   Depression    History of cardioembolic stroke    History of tobacco abuse    Hyperlipidemia with target LDL less than 70    Unfortunately, not willing to take statin.  Did not start statin that was ordered in the hospital.  She is very fearful of side effects.   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   TIA due to embolism (HCC) 03/2022   Past Surgical History:  Procedure Laterality Date   BREAST SURGERY     TOTAL MASTECTOMY     Left in 1986, Right in 1994   Family History  Problem Relation Age of Onset   Heart attack Mother    Cancer Father        colon   Stroke Father    Cancer Sister        ovarian   Cancer Daughter        ovarian   Cancer Daughter        Breast   Cancer Maternal Grandmother        breast   Social History   Socioeconomic History   Marital status: Widowed    Spouse name: Not on file   Number of  children: Not on file   Years of education: Not on file   Highest education level: Not on file  Occupational History   Not on file  Tobacco Use   Smoking status: Former    Types: Cigarettes   Smokeless tobacco: Never  Substance and Sexual Activity   Alcohol use: Not Currently   Drug use: Never   Sexual activity: Not Currently  Other Topics Concern   Not on file  Social History Narrative   Very upset.  Daughter is dying of cancer.  She feels very guilty.   Cries a lot.   Also, recently moved out of her house to downsize after living there for 40 years.  Been quite emotional event.  Lots of social stress.   Social Determinants of Health   Financial Resource Strain: Low Risk  (06/21/2023)   Overall Financial Resource Strain (CARDIA)    Difficulty of Paying Living  Expenses: Not very hard  Food Insecurity: No Food Insecurity (06/21/2023)   Hunger Vital Sign    Worried About Running Out of Food in the Last Year: Never true    Ran Out of Food in the Last Year: Never true  Transportation Needs: No Transportation Needs (06/21/2023)   PRAPARE - Administrator, Civil Service (Medical): No    Lack of Transportation (Non-Medical): No  Physical Activity: Inactive (06/21/2023)   Exercise Vital Sign    Days of Exercise per Week: 0 days    Minutes of Exercise per Session: 0 min  Stress: No Stress Concern Present (06/21/2023)   [employer] of Occupational Health - Occupational Stress Questionnaire    Feeling of Stress : Only a little  Social Connections: Socially Isolated (06/21/2023)   Social Connection and Isolation Panel [NHANES]    Frequency of Communication with Friends and Family: More than three times a week    Frequency of Social Gatherings with Friends and Family: Never    Attends Religious Services: Never    Database administrator or Organizations: No    Attends Banker Meetings: Never    Marital Status: Widowed    Tobacco Counseling Counseling given: Not Answered   Clinical Intake:  Pre-visit preparation completed: Yes  Pain : No/denies pain     BMI - recorded: 38.58 Nutritional Status: BMI > 30  Obese Nutritional Risks: None Diabetes: No  How often do you need to have someone help you when you read instructions, pamphlets, or other written materials from your doctor or pharmacy?: 1 - Never  Interpreter Needed?: No  Comments: son lives with her Information entered by :: B.Renelle Stegenga,LPN   Activities of Daily Living    06/21/2023   12:10 PM 03/22/2023    3:55 PM  In your present state of health, do you have any difficulty performing the following activities:  Hearing? 0 0  Vision? 0 0  Difficulty concentrating or making decisions? 1 0  Walking or climbing stairs? 1 1  Dressing or bathing? 0 0  Doing  errands, shopping? 1 0  Preparing Food and eating ? N   Using the Toilet? N   In the past six months, have you accidently leaked urine? Y   Do you have problems with loss of bowel control? N   Managing your Medications? N   Managing your Finances? N   Housekeeping or managing your Housekeeping? Y     Patient Care Team: Ronnald Ramp, MD as PCP - General (Family Medicine) Marykay Lex, MD as PCP -  Cardiology (Cardiology)  Indicate any recent Medical Services you may have received from other than [facility] providers in the past year (date may be approximate).     Assessment:   This is a routine wellness examination for Isys.  Hearing/Vision screen Hearing Screening - Comments:: Adequate hearing Vision Screening - Comments:: Adequate vision;readers only No appt in along time (several years)  Dietary issues and exercise activities discussed:     Goals Addressed               This Visit's Progress     Activity and Exercise Increased (pt-stated)   Not on track     She would like to start walking in the park near their apartment       Depression Screen    06/21/2023   12:03 PM 03/22/2023    3:55 PM 02/22/2023    1:33 PM 01/25/2023    2:31 PM  PHQ 2/9 Scores  PHQ - 2 Score 1 3 0 1  PHQ- 9 Score  5 3 5     Fall Risk    06/21/2023   11:59 AM 02/22/2023    1:33 PM 01/25/2023    2:31 PM  Fall Risk   Falls in the past year? 0 0 0  Number falls in past yr: 0 0 0  Injury with Fall? 0 0 0  Risk for fall due to : No Fall Risks No Fall Risks No Fall Risks  Follow up Falls prevention discussed;Education provided      MEDICARE RISK AT HOME:  Medicare Risk at Home - 06/21/23 1159     Any stairs in or around the home? No    If so, are there any without handrails? No    Home free of loose throw rugs in walkways, pet beds, electrical cords, etc? Yes    Adequate lighting in your home to reduce risk of falls? Yes    Life alert? No    Use of a cane, walker or w/c? Yes    cane when outside house due to unsteady gait;has walker that does not use   Grab bars in the bathroom? Yes    Shower chair or bench in shower? No    Elevated toilet seat or a handicapped toilet? No             TIMED UP AND GO:  Was the test performed? No    Cognitive Function:        06/21/2023   12:13 PM  6CIT Screen  What Year? 0 points  What month? 0 points  What time? 0 points  Count back from 20 0 points  Months in reverse 0 points  Repeat phrase 0 points  Total Score 0 points    Immunizations  There is no immunization history on file for this patient.  TDAP status: Up to date  Flu Vaccine status: Declined, Education has been provided regarding the importance of this vaccine but patient still declined. Advised may receive this vaccine at local pharmacy or Health Dept. Aware to provide a copy of the vaccination record if obtained from local pharmacy or Health Dept. Verbalized acceptance and understanding.  Pneumococcal vaccine status: Due, Education has been provided regarding the importance of this vaccine. Advised may receive this vaccine at local pharmacy or Health Dept. Aware to provide a copy of the vaccination record if obtained from local pharmacy or Health Dept. Verbalized acceptance and understanding.  Covid-19 vaccine status: Declined, Education has been provided regarding the importance of  this vaccine but patient still declined. Advised may receive this vaccine at local pharmacy or Health Dept.or vaccine clinic. Aware to provide a copy of the vaccination record if obtained from local pharmacy or Health Dept. Verbalized acceptance and understanding.  Qualifies for Shingles Vaccine? Yes   Zostavax completed No   Shingrix Completed?: No.    Education has been provided regarding the importance of this vaccine. Patient has been advised to call insurance company to determine out of pocket expense if they have not yet received this vaccine. Advised may also  receive vaccine at local pharmacy or Health Dept. Verbalized acceptance and understanding.  Screening Tests Health Maintenance  Topic Date Due   COVID-19 Vaccine (1) Never done   DTaP/Tdap/Td (1 - Tdap) Never done   Zoster Vaccines- Shingrix (1 of 2) Never done   Pneumonia Vaccine 3+ Years old (1 of 1 - PCV) Never done   DEXA SCAN  Never done   INFLUENZA VACCINE  06/22/2023   Medicare Annual Wellness (AWV)  06/20/2024   HPV VACCINES  Aged Out    Health Maintenance  Health Maintenance Due  Topic Date Due   COVID-19 Vaccine (1) Never done   DTaP/Tdap/Td (1 - Tdap) Never done   Zoster Vaccines- Shingrix (1 of 2) Never done   Pneumonia Vaccine 9+ Years old (1 of 1 - PCV) Never done   DEXA SCAN  Never done    Colorectal cancer screening: No longer required.   Mammogram status: No longer required due to age.  Bone Density: pt declines  Lung Cancer Screening: (Low Dose CT Chest recommended if Age 26-80 years, 20 pack-year currently smoking OR have quit w/in 15years.) does not qualify.   Lung Cancer Screening Referral: no  Additional Screening:  Hepatitis C Screening: does not qualify; Completed yes  Vision Screening: Recommended annual ophthalmology exams for early detection of glaucoma and other disorders of the eye. Is the patient up to date with their annual eye exam?  Yes  Who is the provider or what is the name of the office in which the patient attends annual eye exams? [facility] If pt is not established with a provider, would they like to be referred to a provider to establish care? No .   Dental Screening: Recommended annual dental exams for proper oral hygiene  Diabetic Foot Exam: n/a  Community Resource Referral / Chronic Care Management: CRR required this visit?  No   CCM required this visit?  Appt made w/ PCP    Plan:     I have personally reviewed and noted the following in the patient's chart:   Medical and social history Use of alcohol,  tobacco or illicit drugs  Current medications and supplements including opioid prescriptions. Patient is not currently taking opioid prescriptions. Functional ability and status Nutritional status Physical activity Advanced directives List of other physicians Hospitalizations, surgeries, and ER visits in previous 12 months Vitals Screenings to include cognitive, depression, and falls Referrals and appointments  In addition, I have reviewed and discussed with patient certain preventive protocols, quality metrics, and best practice recommendations. A written personalized care plan for preventive services as well as general preventive health recommendations were provided to patient.     Sue Lush, LPN   [id number]   After Visit Summary: (Declined) Due to this being a telephonic visit, with patients personalized plan was offered to patient but patient Declined AVS at this time   Nurse Notes: pt relays she has has some spotting the  last couple of days. Pt encouraged to make appt asap to be evaluated. Appt made Pt will check on eye appt (to see if current/thinks she is)

## 2023-06-21 NOTE — Patient Instructions (Signed)
Sydney Vaughan , Thank you for taking time to come for your Medicare Wellness Visit. I appreciate your ongoing commitment to your health goals. Please review the following plan we discussed and let me know if I can assist you in the future.   Referrals/Orders/Follow-Ups/Clinician Recommendations: appt with PCP made  This is a list of the screening recommended for you and due dates:  Health Maintenance  Topic Date Due   COVID-19 Vaccine (1) Never done   DTaP/Tdap/Td vaccine (1 - Tdap) Never done   Zoster (Shingles) Vaccine (1 of 2) Never done   Pneumonia Vaccine (1 of 1 - PCV) Never done   DEXA scan (bone density measurement)  Never done   Flu Shot  06/22/2023   Medicare Annual Wellness Visit  06/20/2024   HPV Vaccine  Aged Out    Advanced directives: (Copy Requested) Please bring a copy of your health care power of attorney and living will to the office to be added to your chart at your convenience.  Next Medicare Annual Wellness Visit scheduled for next year: Yes8/6/24 @ 11:45am telephone  Preventive Care 65 Years and Older, Female Preventive care refers to lifestyle choices and visits with your health care provider that can promote health and wellness. What does preventive care include? A yearly physical exam. This is also called an annual well check. Dental exams once or twice a year. Routine eye exams. Ask your health care provider how often you should have your eyes checked. Personal lifestyle choices, including: Daily care of your teeth and gums. Regular physical activity. Eating a healthy diet. Avoiding tobacco and drug use. Limiting alcohol use. Practicing safe sex. Taking low-dose aspirin every day. Taking vitamin and mineral supplements as recommended by your health care provider. What happens during an annual well check? The services and screenings done by your health care provider during your annual well check will depend on your age, overall health, lifestyle risk  factors, and family history of disease. Counseling  Your health care provider may ask you questions about your: Alcohol use. Tobacco use. Drug use. Emotional well-being. Home and relationship well-being. Sexual activity. Eating habits. History of falls. Memory and ability to understand (cognition). Work and work Astronomer. Reproductive health. Screening  You may have the following tests or measurements: Height, weight, and BMI. Blood pressure. Lipid and cholesterol levels. These may be checked every 5 years, or more frequently if you are over 28 years old. Skin check. Lung cancer screening. You may have this screening every year starting at age 81 if you have a 30-pack-year history of smoking and currently smoke or have quit within the past 15 years. Fecal occult blood test (FOBT) of the stool. You may have this test every year starting at age 2. Flexible sigmoidoscopy or colonoscopy. You may have a sigmoidoscopy every 5 years or a colonoscopy every 10 years starting at age 53. Hepatitis C blood test. Hepatitis B blood test. Sexually transmitted disease (STD) testing. Diabetes screening. This is done by checking your blood sugar (glucose) after you have not eaten for a while (fasting). You may have this done every 1-3 years. Bone density scan. This is done to screen for osteoporosis. You may have this done starting at age 3. Mammogram. This may be done every 1-2 years. Talk to your health care provider about how often you should have regular mammograms. Talk with your health care provider about your test results, treatment options, and if necessary, the need for more tests. Vaccines  Your health  care provider may recommend certain vaccines, such as: Influenza vaccine. This is recommended every year. Tetanus, diphtheria, and acellular pertussis (Tdap, Td) vaccine. You may need a Td booster every 10 years. Zoster vaccine. You may need this after age 63. Pneumococcal 13-valent  conjugate (PCV13) vaccine. One dose is recommended after age 90. Pneumococcal polysaccharide (PPSV23) vaccine. One dose is recommended after age 41. Talk to your health care provider about which screenings and vaccines you need and how often you need them. This information is not intended to replace advice given to you by your health care provider. Make sure you discuss any questions you have with your health care provider. Document Released: 12/04/2015 Document Revised: 07/27/2016 Document Reviewed: 09/08/2015 Elsevier Interactive Patient Education  2017 [employer].  Fall Prevention in the Home Falls can cause injuries. They can happen to people of all ages. There are many things you can do to make your home safe and to help prevent falls. What can I do on the outside of my home? Regularly fix the edges of walkways and driveways and fix any cracks. Remove anything that might make you trip as you walk through a door, such as a raised step or threshold. Trim any bushes or trees on the path to your home. Use bright outdoor lighting. Clear any walking paths of anything that might make someone trip, such as rocks or tools. Regularly check to see if handrails are loose or broken. Make sure that both sides of any steps have handrails. Any raised decks and porches should have guardrails on the edges. Have any leaves, snow, or ice cleared regularly. Use sand or salt on walking paths during winter. Clean up any spills in your garage right away. This includes oil or grease spills. What can I do in the bathroom? Use night lights. Install grab bars by the toilet and in the tub and shower. Do not use towel bars as grab bars. Use non-skid mats or decals in the tub or shower. If you need to sit down in the shower, use a plastic, non-slip stool. Keep the floor dry. Clean up any water that spills on the floor as soon as it happens. Remove soap buildup in the tub or shower regularly. Attach bath mats  securely with double-sided non-slip rug tape. Do not have throw rugs and other things on the floor that can make you trip. What can I do in the bedroom? Use night lights. Make sure that you have a light by your bed that is easy to reach. Do not use any sheets or blankets that are too big for your bed. They should not hang down onto the floor. Have a firm chair that has side arms. You can use this for support while you get dressed. Do not have throw rugs and other things on the floor that can make you trip. What can I do in the kitchen? Clean up any spills right away. Avoid walking on wet floors. Keep items that you use a lot in easy-to-reach places. If you need to reach something above you, use a strong step stool that has a grab bar. Keep electrical cords out of the way. Do not use floor polish or wax that makes floors slippery. If you must use wax, use non-skid floor wax. Do not have throw rugs and other things on the floor that can make you trip. What can I do with my stairs? Do not leave any items on the stairs. Make sure that there are handrails on  both sides of the stairs and use them. Fix handrails that are broken or loose. Make sure that handrails are as long as the stairways. Check any carpeting to make sure that it is firmly attached to the stairs. Fix any carpet that is loose or worn. Avoid having throw rugs at the top or bottom of the stairs. If you do have throw rugs, attach them to the floor with carpet tape. Make sure that you have a light switch at the top of the stairs and the bottom of the stairs. If you do not have them, ask someone to add them for you. What else can I do to help prevent falls? Wear shoes that: Do not have high heels. Have rubber bottoms. Are comfortable and fit you well. Are closed at the toe. Do not wear sandals. If you use a stepladder: Make sure that it is fully opened. Do not climb a closed stepladder. Make sure that both sides of the stepladder  are locked into place. Ask someone to hold it for you, if possible. Clearly mark and make sure that you can see: Any grab bars or handrails. First and last steps. Where the edge of each step is. Use tools that help you move around (mobility aids) if they are needed. These include: Canes. Walkers. Scooters. Crutches. Turn on the lights when you go into a dark area. Replace any light bulbs as soon as they burn out. Set up your furniture so you have a clear path. Avoid moving your furniture around. If any of your floors are uneven, fix them. If there are any pets around you, be aware of where they are. Review your medicines with your doctor. Some medicines can make you feel dizzy. This can increase your chance of falling. Ask your doctor what other things that you can do to help prevent falls. This information is not intended to replace advice given to you by your health care provider. Make sure you discuss any questions you have with your health care provider. Document Released: 09/03/2009 Document Revised: 04/14/2016 Document Reviewed: 12/12/2014 Elsevier Interactive Patient Education  2017 [employer].

## 2023-07-04 NOTE — Progress Notes (Deleted)
      Established patient visit   Patient: Sydney Vaughan   DOB: [birth year]   82 y.o. Female  MRN: [id number] Visit Date: 07/06/2023  Today's healthcare provider: Ronnald Ramp, MD   No chief complaint on file.  Subjective       Discussed the use of AI scribe software for clinical note transcription with the patient, who gave verbal consent to proceed.  History of Present Illness             Medications: Outpatient Medications Prior to Visit  Medication Sig   atenolol (TENORMIN) 50 MG tablet TAKE 1 TABLET BY MOUTH TWICE  DAILY   clonazePAM (KLONOPIN) 0.5 MG tablet Take 1 tablet (0.5 mg total) by mouth daily as needed for anxiety.   ELIQUIS 5 MG TABS tablet TAKE 1 TABLET BY MOUTH TWICE  DAILY   sertraline (ZOLOFT) 25 MG tablet Take 1 tablet (25 mg total) by mouth daily.   No facility-administered medications prior to visit.    Review of Systems  {Insert previous labs (optional):23779} {See past labs  Heme  Chem  Endocrine  Serology  Results Review (optional):1}   Objective    There were no vitals taken for this visit. {Insert last BP/Wt (optional):23777}{See vitals history (optional):1}   Physical Exam  ***  No results found for any visits on 07/06/23.  Assessment & Plan     Problem List Items Addressed This Visit   None   Assessment and Plan              No follow-ups on file.         Ronnald Ramp, MD  [facility] [phone] (phone) [phone] (fax)  [facility]

## 2023-07-06 ENCOUNTER — Other Ambulatory Visit: Payer: Self-pay | Admitting: Family Medicine

## 2023-07-06 ENCOUNTER — Ambulatory Visit: Payer: Medicare Other | Admitting: Family Medicine

## 2023-07-06 DIAGNOSIS — N950 Postmenopausal bleeding: Secondary | ICD-10-CM

## 2023-07-06 DIAGNOSIS — F411 Generalized anxiety disorder: Secondary | ICD-10-CM

## 2023-07-07 ENCOUNTER — Telehealth: Payer: Self-pay | Admitting: Cardiology

## 2023-07-07 NOTE — Telephone Encounter (Signed)
Requested medication (s) are due for refill today -yes  Requested medication (s) are on the active medication list -yes  Future visit scheduled -yes  Last refill: 05/17/23 #30  Notes to clinic: non delegated Rx  Requested Prescriptions  Pending Prescriptions Disp Refills   clonazePAM (KLONOPIN) 0.5 MG tablet [Pharmacy Med Name: clonazePAM 0.5 MG Oral Tablet] 30 tablet     Sig: TAKE 1 TABLET BY MOUTH DAILY AS  NEEDED FOR ANXIETY     Not Delegated - Psychiatry: Anxiolytics/Hypnotics 2 Failed - 07/06/2023  5:09 PM      Failed - This refill cannot be delegated      Failed - Urine Drug Screen completed in last 360 days      Passed - Patient is not pregnant      Passed - Valid encounter within last 6 months    Recent Outpatient Visits           3 months ago Anxiety state   [place] [facility] Simmons-Robinson, [place], MD   4 months ago SOBOE (shortness of breath on exertion)   [place] [facility] Simmons-Robinson, [place], MD   5 months ago Encounter to establish care   [place] [facility] [place], [place], MD       Future Appointments             In 2 weeks Simmons-Robinson, Tawanna Cooler, MD [facility]   In 2 months Herbie Baltimore, Piedad Climes, MD [facility]   In 2 months Simmons-Robinson, Tawanna Cooler, MD [facility]               Requested Prescriptions  Pending Prescriptions Disp Refills   clonazePAM (KLONOPIN) 0.5 MG tablet [Pharmacy Med Name: clonazePAM 0.5 MG Oral Tablet] 30 tablet     Sig: TAKE 1 TABLET BY MOUTH DAILY AS  NEEDED FOR ANXIETY     Not Delegated - Psychiatry: Anxiolytics/Hypnotics 2 Failed - 07/06/2023  5:09 PM      Failed - This refill cannot be delegated      Failed - Urine Drug Screen completed in last 360 days      Passed - Patient is not pregnant      Passed - Valid encounter within last 6 months     Recent Outpatient Visits           3 months ago Anxiety state   [place] [facility] Simmons-Robinson, [place], MD   4 months ago SOBOE (shortness of breath on exertion)   [facility] Simmons-Robinson, [place], MD   5 months ago Encounter to establish care   [place] [facility] [place], [place], MD       Future Appointments             In 2 weeks Simmons-Robinson, Tawanna Cooler, MD [facility]   In 2 months Herbie Baltimore, Piedad Climes, MD [facility] [place]   In 2 months Simmons-Robinson, Tawanna Cooler, MD [facility], [facility]

## 2023-07-07 NOTE — Telephone Encounter (Signed)
Left a message for the patient to call back.  

## 2023-07-07 NOTE — Telephone Encounter (Signed)
Pt states she is having some vaginal bleeding and would like to know if she needs to stop taking Eliquis. Please advise

## 2023-07-10 ENCOUNTER — Ambulatory Visit: Payer: Self-pay

## 2023-07-10 NOTE — Telephone Encounter (Signed)
Spoke to the patient concerning her vaginal bleeding. This has been going on for a few months now. This has been intermittent spotting. It is not heavy bleeding. She also has pain when she urinates. She stated that she has a PCP appointment on 9/3. She has been advised to call and see if she can get in sooner due to her symptoms.   She would like to know if she should hold the Eliquis since she is having spotting. She has been advised to keep taking it until she hears otherwise from the office.

## 2023-07-10 NOTE — Telephone Encounter (Signed)
Pt is returning call.  

## 2023-07-10 NOTE — Telephone Encounter (Signed)
Patient has history of CVA and TIA. Hesitant to recommend holding Eliquis since we do not know the cause of the bleeding. Suggest she also reach out to GYN as well as her PCP. Has history of ovarian cancer.

## 2023-07-10 NOTE — Telephone Encounter (Signed)
Pt returning call

## 2023-07-10 NOTE — Telephone Encounter (Signed)
Left a message for the patient to call back.  

## 2023-07-10 NOTE — Telephone Encounter (Signed)
Patient has been made aware and verbalized her understanding of not stopping the Eliquis. She will update when she can.

## 2023-07-10 NOTE — Telephone Encounter (Signed)
Chief Complaint: Blood in the urine  Symptoms: vaginal burning and itching, blood in urine, painful urination  Frequency: comes and goes  Pertinent Negatives: Patient denies clots in the blood, odor  Disposition: [] ED /[] Urgent Care (no appt availability in office) / [x] Appointment(In office/virtual)/ []  [facility]/ [] Home Care/ [] Refused Recommended Disposition /[] [place] [facility]/ []  Follow-up with PCP Additional Notes: Patient states she has been experiencing blood in her urine for about 2 months now and it comes and goes. Patient states she is on Eliquis as well. Patient also reports vaginal burning, vaginal itching and pain with urination. Patient stated that she wanted to drop off a urine sample to the office. Care advice given and advised patient that she would need to be evaluated. Patient was agreeable and appointment has been scheduled 07/25/23. Advised patient to callback if symptoms get worse before her scheduled appointment.   Summary: vaginal burning, stinging, and frequent urinating bleeding for a month.   Pt stated has had vaginal burning, stinging, and frequent urinating bleeding for a month. Per previous TE from 06/14 pt, it has reported this but has not come in to be seen. Pt has an appointment on 09/03.  She stated she needs an appointment for the end of the week.  Seeking clinical advice.     Reason for Disposition  Urination is difficult to start (i.e., hesitancy) or straining  Answer Assessment - Initial Assessment Questions 1. SYMPTOM: "What's the main symptom you're concerned about?" (e.g., frequency, incontinence)     Blood in the urine 2. ONSET: "When did the  blooding  start?"     June 2024 3. PAIN: "Is there any pain?" If Yes, ask: "How bad is it?" (Scale: 1-10; mild, moderate, severe)     Severe pain when urinating  4. CAUSE: "What do you think is causing the symptoms?"     Urine infection  5. OTHER SYMPTOMS: "Do you have any other symptoms?"  (e.g., blood in urine, fever, flank pain, pain with urination)   Vaginal  Burning, itching, blood in urine  Protocols used: Urinary Symptoms-A-AH

## 2023-07-14 ENCOUNTER — Other Ambulatory Visit: Payer: Self-pay | Admitting: Family Medicine

## 2023-07-14 DIAGNOSIS — F411 Generalized anxiety disorder: Secondary | ICD-10-CM

## 2023-07-14 DIAGNOSIS — F32A Depression, unspecified: Secondary | ICD-10-CM

## 2023-07-25 ENCOUNTER — Encounter: Payer: Self-pay | Admitting: Family Medicine

## 2023-07-25 ENCOUNTER — Ambulatory Visit (INDEPENDENT_AMBULATORY_CARE_PROVIDER_SITE_OTHER): Payer: Medicare Other | Admitting: Family Medicine

## 2023-07-25 VITALS — BP 145/70 | HR 50 | Ht 66.0 in | Wt 244.3 lb

## 2023-07-25 DIAGNOSIS — N3946 Mixed incontinence: Secondary | ICD-10-CM | POA: Diagnosis not present

## 2023-07-25 DIAGNOSIS — Z23 Encounter for immunization: Secondary | ICD-10-CM | POA: Diagnosis not present

## 2023-07-25 DIAGNOSIS — R300 Dysuria: Secondary | ICD-10-CM | POA: Diagnosis not present

## 2023-07-25 DIAGNOSIS — N952 Postmenopausal atrophic vaginitis: Secondary | ICD-10-CM | POA: Diagnosis not present

## 2023-07-25 DIAGNOSIS — B3731 Acute candidiasis of vulva and vagina: Secondary | ICD-10-CM

## 2023-07-25 DIAGNOSIS — Z0000 Encounter for general adult medical examination without abnormal findings: Secondary | ICD-10-CM | POA: Diagnosis not present

## 2023-07-25 MED ORDER — ESTRADIOL 0.1 MG/GM VA CREA: 1.0000 | TOPICAL_CREAM | Freq: Every day | VAGINAL | 12 refills | Status: DC

## 2023-07-25 MED ORDER — FLUCONAZOLE 150 MG PO TABS: ORAL_TABLET | ORAL | 0 refills | Status: DC

## 2023-07-25 NOTE — Assessment & Plan Note (Signed)
Patient reports intermittent vaginal bleeding, urinary frequency, and discomfort during urination. Possible atrophic vaginitis due to postmenopausal estrogen deficiency, urinary incontinence, and potential urinary tract infection. -Collect urine for culture to rule out UTI. -Prescribe vaginal esterase 0.1 mg cream for potential atrophic vaginitis. Use nightly for two weeks, then 2-3 times per week. -Prescribe Diflucan 150 mg for potential yeast infection. Take one dose, and a second dose after three days if symptoms persist. -patient unable to provide urine sample today after water and waiting 15 mins

## 2023-07-25 NOTE — Progress Notes (Signed)
Established patient visit   Patient: Sydney Vaughan   DOB: 28-Dec-1940   82 y.o. Female  MRN: [id number] Visit Date: 07/25/2023  Today's healthcare provider: Ronnald Ramp, MD   No chief complaint on file.  Subjective     HPI   Patient is present due to vaginal burning with a small amount of blood. Symptoms present for 6 months periodically and restarted Sunday.  Last edited by Acey Lav, CMA on 07/25/2023  2:49 PM.       Discussed the use of AI scribe software for clinical note transcription with the patient, who gave verbal consent to proceed.  History of Present Illness   The patient presents with complaints of urinary symptoms including frequent urination, urinary urgency, and incontinence. She describes a sensation of burning and itching during urination, which she finds distressing. She also reports occasional vaginal bleeding, which she notices when wiping after urination. The patient describes the bleeding as intermittent and not associated with pain. She has been managing her urinary incontinence by wearing pads around the clock.  The patient has a history of urinary tract infections and is currently on blood thinners. She believes her symptoms may be related to the use of these medications. She also expresses concern about decreased physical activity since moving into an apartment, and is considering the use of a leg exerciser to improve her mobility.  The patient denies any abdominal pain or discomfort. She has not taken any medications for her urinary symptoms in the past. She reports no other new symptoms or changes in her health.         Medications: Outpatient Medications Prior to Visit  Medication Sig   atenolol (TENORMIN) 50 MG tablet TAKE 1 TABLET BY MOUTH TWICE  DAILY   clonazePAM (KLONOPIN) 0.5 MG tablet TAKE 1 TABLET BY MOUTH DAILY AS  NEEDED FOR ANXIETY   ELIQUIS 5 MG TABS tablet TAKE 1 TABLET BY MOUTH TWICE  DAILY   sertraline (ZOLOFT)  25 MG tablet TAKE 2 TABLETS BY MOUTH DAILY   No facility-administered medications prior to visit.    Review of Systems      Objective    BP (!) 145/70 (BP Location: Right Wrist, Patient Position: Sitting, Cuff Size: Normal)   Pulse (!) 50   Ht 5\' 6"  (1.676 m)   Wt 244 lb 4.8 oz (110.8 kg)   SpO2 99%   BMI 39.43 kg/m  BP Readings from Last 3 Encounters:  07/25/23 (!) 145/70  03/22/23 100/69  02/22/23 (!) 95/56   Wt Readings from Last 3 Encounters:  07/25/23 244 lb 4.8 oz (110.8 kg)  06/21/23 239 lb (108.4 kg)  03/22/23 239 lb 11.2 oz (108.7 kg)       Physical Exam Vitals reviewed.  Constitutional:      General: She is not in acute distress.    Appearance: Normal appearance. She is obese. She is not ill-appearing, toxic-appearing or diaphoretic.  Eyes:     Conjunctiva/sclera: Conjunctivae normal.  Cardiovascular:     Rate and Rhythm: Regular rhythm. Bradycardia present.     Pulses: Normal pulses.     Heart sounds: Normal heart sounds. No murmur heard.    No friction rub. No gallop.  Pulmonary:     Effort: Pulmonary effort is normal. No respiratory distress.     Breath sounds: Normal breath sounds. No stridor. No wheezing, rhonchi or rales.  Abdominal:     General: Bowel sounds are normal. There is no distension.  Palpations: Abdomen is soft.     Tenderness: There is no abdominal tenderness. There is no right CVA tenderness, left CVA tenderness or guarding.  Neurological:     Mental Status: She is alert and oriented to person, place, and time.    Pt declined pelvic exam    No results found for any visits on 07/25/23.  Assessment & Plan     Problem List Items Addressed This Visit     Atrophic vaginitis - Primary    Patient reports intermittent vaginal bleeding, urinary frequency, and discomfort during urination. Possible atrophic vaginitis due to postmenopausal estrogen deficiency, urinary incontinence, and potential urinary tract infection. -Collect  urine for culture to rule out UTI. -Prescribe vaginal esterase 0.1 mg cream for potential atrophic vaginitis. Use nightly for two weeks, then 2-3 times per week. -Prescribe Diflucan 150 mg for potential yeast infection. Take one dose, and a second dose after three days if symptoms persist. -patient unable to provide urine sample today after water and waiting 15 mins      Relevant Medications   estradiol (ESTRACE VAGINAL) 0.1 MG/GM vaginal cream   Healthcare maintenance    Patient expresses desire for leg exerciser for physical activity. -Provided letter of medical necessity for leg exerciser. Advised against using treadmill for exercise       Mixed stress and urge urinary incontinence   Other Visit Diagnoses     Immunization due       Relevant Orders   Flu Vaccine Trivalent High Dose (Fluad) (Completed)   Influenza vaccine needed       Relevant Orders   Flu Vaccine Trivalent High Dose (Fluad) (Completed)   Vaginal candidiasis       Relevant Medications   fluconazole (DIFLUCAN) 150 MG tablet   Dysuria                   Return in about 6 weeks (around 09/05/2023) for urine symptoms .         Ronnald Ramp, MD  [facility] [phone] (phone) [phone] (fax)  [facility]

## 2023-07-25 NOTE — Patient Instructions (Signed)
VISIT SUMMARY:  During your visit, we discussed your concerns about frequent urination, urinary urgency, incontinence, and occasional vaginal bleeding. You also mentioned your decreased physical activity since moving into an apartment and your interest in using a leg exerciser to improve your mobility.  YOUR PLAN:  -VAGINAL BLEEDING AND URINARY SYMPTOMS: Your symptoms may be due to a condition called atrophic vaginitis, which is thinning, drying, and inflammation of the vaginal walls due to your body having less estrogen. Another possibility is a urinary tract infection. We will collect a urine sample to check for this. I am prescribing a vaginal cream (esterase 0.1 mg) to help with the potential atrophic vaginitis. Use this nightly for two weeks, then 2-3 times per week. I am also prescribing Diflucan 150 mg in case you have a yeast infection. Take one dose, and a second dose after three days if symptoms persist.  -GENERAL HEALTH MAINTENANCE: To help with your physical activity, I will provide a letter of medical necessity for a leg exercicer.  INSTRUCTIONS:  Please use the prescribed vaginal cream nightly for two weeks, then 2-3 times per week. Take one dose of Diflucan 150 mg, and a second dose after three days if symptoms persist. We will also collect a urine sample to check for a urinary tract infection. I will provide a letter of medical necessity for a leg exerciser to help with your physical activity.

## 2023-07-25 NOTE — Assessment & Plan Note (Signed)
Patient expresses desire for leg exerciser for physical activity. -Provided letter of medical necessity for leg exerciser. Advised against using treadmill for exercise

## 2023-08-15 ENCOUNTER — Telehealth: Payer: Self-pay | Admitting: Cardiology

## 2023-08-15 NOTE — Telephone Encounter (Signed)
OK to hold Eliquis x 3-5 days while on Abx.  Bryan Lemma, MD

## 2023-08-15 NOTE — Telephone Encounter (Signed)
Pt states she is having vaginal bleeding and would like to stop eliquis to see if this is the problem. She wants to know Dr. Elissa Hefty thoughts on it.

## 2023-08-15 NOTE — Telephone Encounter (Signed)
Returned call to pt she states that she has a UTI and is taking Antibiotic for this and she has some vag bleeding and would like to stop the Eliquis to see if this helps with the bleeding. She seems to only notice the bleeding when she goes to the bathroom not noticed any flow the rest of the time. She states that she has spoken with her PCP and was rx'd Diflucan on 07-25-23 and has finished. What should we do?  Please advise.

## 2023-08-16 NOTE — Telephone Encounter (Signed)
Patient was calling back for update. Please advise

## 2023-08-16 NOTE — Telephone Encounter (Signed)
Advised  direction of physician.  She states she has completed the ATB, but states she is bleeding daily rather than once a week in urine. She will hold for 3 days to see if the bleeding resolves or decreases.  She will restart after that unless further instructionsShe is going to follow with PCP asap since it is becoming more frequent

## 2023-08-16 NOTE — Telephone Encounter (Signed)
That sounds reasonable Needs PCP to assess hematuria HOld Eliquis x 3-4 days to see if it helps  [facility]

## 2023-08-17 ENCOUNTER — Telehealth: Payer: Self-pay

## 2023-08-17 DIAGNOSIS — R319 Hematuria, unspecified: Secondary | ICD-10-CM

## 2023-08-17 NOTE — Telephone Encounter (Signed)
Patient reports she was advised to stop the blood thinner for 3 days. Reports that the bleeding from her urine has stopped. Now is just some slight discoloration.  Not sure where to put the referral nephro or urology? Have pended Urology for you to review.Marland Kitchen

## 2023-08-17 NOTE — Telephone Encounter (Signed)
Patient is aware to hold Eliquis and she is contacting PCP for further evaluation of urinary issues with bleeding

## 2023-08-17 NOTE — Telephone Encounter (Signed)
Did not see where patient received Dr. Erich Montane instructions to follow up with PCP on increased bleeding. Called, no answer. LVMTCB.

## 2023-08-17 NOTE — Telephone Encounter (Signed)
Left detailed message for pt to call PCP to figure out hematuria and hold Eliquis 3-4 days if needed.

## 2023-08-17 NOTE — Telephone Encounter (Signed)
Copied from CRM [id number]. Topic: Referral - Request for Referral >> Aug 16, 2023  4:55 PM Marlow Baars wrote: Has patient seen PCP for this complaint? Yes.   Referral for which specialty: Urologist Preferred provider/office: Whatever urologist her provider recommends or [facility] if that is good Reason for referral: Vaginal bleeding  The patient states when she goes to the bathroom but she hurts and is bleeding every time she goes now. She states the medicine is helping a little but she is still bleeding and has the pain so she wants to see a specialist. Please assist patient further

## 2023-08-25 ENCOUNTER — Other Ambulatory Visit: Payer: Self-pay | Admitting: Family Medicine

## 2023-08-25 DIAGNOSIS — F411 Generalized anxiety disorder: Secondary | ICD-10-CM

## 2023-08-25 NOTE — Telephone Encounter (Signed)
Requested medication (s) are due for refill today - yes  Requested medication (s) are on the active medication list -yes  Future visit scheduled -yes  Last refill: 07/10/23 #30  Notes to clinic: non delegated Rx  Requested Prescriptions  Pending Prescriptions Disp Refills   clonazePAM (KLONOPIN) 0.5 MG tablet [Pharmacy Med Name: clonazePAM 0.5 MG Oral Tablet] 30 tablet     Sig: TAKE 1 TABLET BY MOUTH DAILY AS  NEEDED FOR ANXIETY     Not Delegated - Psychiatry: Anxiolytics/Hypnotics 2 Failed - 08/25/2023  2:17 PM      Failed - This refill cannot be delegated      Failed - Urine Drug Screen completed in last 360 days      Passed - Patient is not pregnant      Passed - Valid encounter within last 6 months    Recent Outpatient Visits           1 month ago Atrophic vaginitis   [place] [facility] Simmons-Robinson, [place], MD   5 months ago Anxiety state   [place] [facility] Simmons-Robinson, [place], MD   6 months ago SOBOE (shortness of breath on exertion)   [place] [facility] Simmons-Robinson, [place], MD   7 months ago Encounter to establish care   [place] [facility] [place], [place], MD       Future Appointments             In 1 week Simmons-Robinson, Tawanna Cooler, MD [facility]   In 1 week Marykay Lex, MD [facility] [place]   In 3 weeks Vanna Scotland, MD [facility] [place]   In 4 weeks Simmons-Robinson, Tawanna Cooler, MD [facility]               Requested Prescriptions  Pending Prescriptions Disp Refills   clonazePAM (KLONOPIN) 0.5 MG tablet [Pharmacy Med Name: clonazePAM 0.5 MG Oral Tablet] 30 tablet     Sig: TAKE 1 TABLET BY MOUTH DAILY AS  NEEDED FOR ANXIETY     Not Delegated - Psychiatry: Anxiolytics/Hypnotics 2 Failed - 08/25/2023  2:17 PM      Failed - This refill cannot be  delegated      Failed - Urine Drug Screen completed in last 360 days      Passed - Patient is not pregnant      Passed - Valid encounter within last 6 months    Recent Outpatient Visits           1 month ago Atrophic vaginitis   [place] [facility] Simmons-Robinson, [place], MD   5 months ago Anxiety state   [facility] Simmons-Robinson, [place], MD   6 months ago SOBOE (shortness of breath on exertion)   [place] [facility] Simmons-Robinson, [place], MD   7 months ago Encounter to establish care   [facility] [place], [place], MD       Future Appointments             In 1 week Simmons-Robinson, Tawanna Cooler, MD [facility]   In 1 week Herbie Baltimore, Piedad Climes, MD [facility] [place]   In 3 weeks Vanna Scotland, MD [facility] [place]   In 4 weeks Simmons-Robinson, Tawanna Cooler, MD [facility], [facility]

## 2023-08-31 ENCOUNTER — Telehealth: Payer: Self-pay | Admitting: Family Medicine

## 2023-09-05 ENCOUNTER — Ambulatory Visit: Payer: Medicare Other | Admitting: Family Medicine

## 2023-09-06 NOTE — Progress Notes (Unsigned)
Cardiology Office Note:  .   Date:  09/07/2023  ID:  Sydney Vaughan, DOB Feb 16, 1941, MRN [id number] PCP: Ronnald Ramp, MD  [facility] Providers Cardiologist:  Bryan Lemma, MD     Chief Complaint  Patient presents with   Follow-up    6 month f/u. Pt c/o fatigue, leg weakness, and blood in urine. Medications reviewed verbally.    Atrial Fibrillation    Thinks Eliquis is making her tired / weak. Concerned about hematuria "bleeding"    Patient Profile: .     Sydney Vaughan is an obese 82 y.o. 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  Sydney Vaughan was seen for initial cardiology evaluation on Apr 07, 2022 with new diagnosis of A-fib RVR in setting of TIA.  A-fib thought to be the cause of her TIA.  Plan was to treat with rate over rhythm control along with atenolol 50 mg daily(increased back from 25-50-as 25 mg twice daily)  and Eliquis 5 mg twice daily.  We would plan to recheck an echo being out of A-fib.  We did not perform ischemic evaluation as she was already tachycardic without symptoms.  She indicated that she did not want to take atorvastatin with concern of side effect.  We suggested potentially considering rosuvastatin.  Stop aspirin and Plavix.  Had been taking as needed Klonopin for tachycardia spells.   Sydney Vaughan was last seen on December 30, 2022 by Dr. Azucena Cecil as a 60-month follow-up.  Noted to be back in A-fib.  Declined cardioversion.  Atenolol increased to 50 mg twice daily per EP clinic. => Both atorvastatin and Macrobid removed from her med list.  Subjective  INTERVAL HPI Called in (August 2024) because of seeing and also some vaginal bleeding.  Asked about potentially stopping Eliquis.   Recommended that she continue. Call back in in September 2024 with daily hematuria.  I recommended she hold Eliquis for 3 to 4 days and be evaluated by PCP.  Discussed the use of AI scribe software for clinical note transcription with the patient, who gave verbal consent to proceed.  History of Present Illness   The patient, an 82 year old with a history of atrial fibrillation (AFib), presented with complaints of fatigue, weakness, and urinary bleeding. The patient reported feeling "slow" and "worn down," attributing this to a lack of sleep and an increase in her atenolol dosage. She reported taking 100mg  of atenolol daily, a significant increase from her previous dosage of 50mg  daily, which she had been on for 40 years. The patient expressed concern that the increased dosage was contributing to her fatigue and weakness.  The patient also reported urinary bleeding, which she described as bright red blood that would last for two to three days before stopping. She noted a correlation between the bleeding and a burning sensation during urination.  In addition to these symptoms, the patient reported feeling "strange" and experiencing occasional mild headaches. She expressed a belief that these symptoms might be related to her use of Eliquis, a blood thinner prescribed due to her AFib. The patient expressed concern about the cost of Eliquis and the potential side effects, including bleeding.  The patient's AFib was reportedly under control with the increased atenolol dosage, but she expressed dissatisfaction with the side effects. She reported  no chest pain, pressure, or tightness, and no shortness of breath when lying down at night. However, she did report shortness of breath when walking, which would resolve upon sitting down.  The patient's overall condition appeared to be impacting her quality of life significantly. She reported needing assistance to get up from the sofa and go to the bathroom, and her  son was arranging for a walker to help her move around more easily. The patient expressed a desire to reduce her medication burden and improve her strength and energy levels.      ROS:  Review of Systems - Negative except symptoms noted above-recurrent UTI/hematuria episodes and generalized fatigue is a major symptom she is feeling.     Objective   Studies Reviewed: Marland Kitchen   EKG Interpretation Date/Time:  Thursday September 07 2023 10:36:11 EDT Ventricular Rate:  92 PR Interval:    QRS Duration:  70 QT Interval:  374 QTC Calculation: 462 R Axis:   14  Text Interpretation: Atrial fibrillation with premature ventricular or aberrantly conducted complexes Nonspecific ST abnormality When compared with ECG of 21-Mar-2022 19:23, Atrial fibrillation NOW PRESENT Confirmed by Bryan Lemma ([id number]) on 09/07/2023 10:47:36 AM    ECHO 03/22/2022: EF 55 to 60%.  Unable to really assess regional wall motion but appear to be normal.  GR 2 DD.  Elevated LAP.  Mild LA dilation.  Normal RV.  Mild AoV calcification/sclerosis but no stenosis.  Mildly elevated RAP.  7-day Zio patch monitor May 2023: 45% A-fib burden with rate range of 53 to 196 bpm.  Longest episode was 1 day and 22 hours with average rate of 90 bpm.  Occasional PVCs (1.7%).  Short PAT runs     Latest Ref Rng & Units 02/22/2023    2:36 PM 03/22/2022    6:48 AM 03/21/2022    6:49 PM  CBC  WBC 3.4 - 10.8 x10E3/uL 9.5  7.6  8.4   Hemoglobin 11.1 - 15.9 g/dL 60.4  54.0  98.1   Hematocrit 34.0 - 46.6 % 43.8  35.3  40.9   Platelets 150 - 450 x10E3/uL 192  135  167         Risk Assessment/Calculations:    CHA2DS2-VASc Score = 7   This indicates a 11.2% annual risk of stroke. The patient's score is based upon: CHF History: 0 HTN History: 1 Diabetes History: 0 Stroke History: 2 Vascular Disease History: 1 Age Score: 2 Gender Score: 1           Physical Exam:   VS:  BP 119/77 (BP Location: Left Arm, Patient Position: Sitting, Cuff Size:  Normal)   Pulse 92   Ht 5\' 6"  (1.676 m)   Wt 253 lb 6.4 oz (114.9 kg)   SpO2 98%   BMI 40.90 kg/m    Wt Readings from Last 3 Encounters:  09/07/23 253 lb 6.4 oz (114.9 kg)  07/25/23 244 lb 4.8 oz (110.8 kg)  06/21/23 239 lb (108.4 kg)    GEN: Well nourished, well developed in no acute distress; morbidly obese.  Sitting in a wheelchair, but has a cane.  Very limited mobility.  Seems tired NECK: No JVD; soft carotid bruit versus radiated aortic valve murmur. CARDIAC: Distant heart sounds with normal S1, S2; irregularly irregular rhythm.  Controlled rate., no rubs or gallops, cannot exclude soft 1/6 SEM. RESPIRATORY:  Clear to auscultation without rales, wheezing or rhonchi ; nonlabored, good air movement. ABDOMEN: Soft, non-tender, non-distended EXTREMITIES:  No edema; No deformity  ASSESSMENT AND PLAN: .    Problem List Items Addressed This Visit       Cardiology Problems   Hyperlipidemia with target LDL less than 70 (Chronic)    Unwilling to take statin.  Does not want to address given her advanced age.       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  If she were to feel itself Morvesin A-fib would take the full 50.      Relevant Medications   atenolol (TENORMIN) 50 MG tablet   Longstanding persistent atrial fibrillation (HCC) - Primary (Chronic)    Patient is experiencing fatigue and weakness, possibly due to high dose of Atenolol. Patient is in Afib 45% of the time, with a CHA2DS2-VASc score of 7, indicating a high risk of stroke (11% annually). Patient is currently on Eliquis for stroke prevention and Atenolol for rate control. Patient has intermittent bleeding, possibly related to urinary tract infection. -Reduce morning dose of Atenolol to 25mg , maintain evening dose at 50mg . -Continue Eliquis, but hold for  2-3 days during bleeding episodes. -Consider use of heart rate monitoring app to guide Atenolol dosing. -Refer to electrophysiologist (Dr. Lalla Brothers) for consideration of Afib ablation or Watchman device.      Relevant Medications   atenolol (TENORMIN) 50 MG tablet   Other Relevant Orders   EKG 12-Lead (Completed)   Ambulatory referral to Cardiac Electrophysiology     Other   Atrophic vaginitis    Not sure if her hematuria is from vaginitis or UTI.  Monitored by PCP.  Due to see urology.  Question if she is also seeing gynecology.      Hematuria due to cystitis    Due to see urology.  For now it is okay to hold Eliquis for 2 to 3 days if there is significant bleeding, but would otherwise try to keep her on DOAC.  If it becomes too much of an issue, then she would have to be agreeable to stop it which means the increasing risk of stroke..      Recurrent UTI    Patient reports urinary symptoms and intermittent bleeding, possibly due to urinary tract infection. -Continue current treatment plan with urologist. -Do not hold Eliquis prior to urology visit to aid in identifying potential bleeding source. -If cystoscopy is needed, hold Eliquis for 2-3 days prior to procedure.      Other Visit Diagnoses     Primary hypertension       Relevant Medications   atenolol (TENORMIN) 50 MG tablet           Dispo: Return in about 6 months (around 03/07/2024) for 6 month follow-up with me. Follow-up in 4 months after electrophysiology consultation.   Total time spent: 46 min spent with patient + 18 min spent charting = 64 min      Signed, Marykay Lex, MD, MS Bryan Lemma, M.D., M.S. Interventional Cardiologist  [facility]  Pager # [phone] Phone # [phone] [address] [place], [place] [address]

## 2023-09-07 ENCOUNTER — Encounter: Payer: Self-pay | Admitting: Cardiology

## 2023-09-07 ENCOUNTER — Ambulatory Visit: Payer: Medicare Other | Attending: Cardiology | Admitting: Cardiology

## 2023-09-07 VITALS — BP 119/77 | HR 92 | Ht 66.0 in | Wt 253.4 lb

## 2023-09-07 DIAGNOSIS — N390 Urinary tract infection, site not specified: Secondary | ICD-10-CM | POA: Insufficient documentation

## 2023-09-07 DIAGNOSIS — E785 Hyperlipidemia, unspecified: Secondary | ICD-10-CM

## 2023-09-07 DIAGNOSIS — I4811 Longstanding persistent atrial fibrillation: Secondary | ICD-10-CM | POA: Diagnosis not present

## 2023-09-07 DIAGNOSIS — E861 Hypovolemia: Secondary | ICD-10-CM

## 2023-09-07 DIAGNOSIS — N3091 Cystitis, unspecified with hematuria: Secondary | ICD-10-CM | POA: Insufficient documentation

## 2023-09-07 DIAGNOSIS — I10 Essential (primary) hypertension: Secondary | ICD-10-CM | POA: Diagnosis not present

## 2023-09-07 DIAGNOSIS — N952 Postmenopausal atrophic vaginitis: Secondary | ICD-10-CM

## 2023-09-07 DIAGNOSIS — I4891 Unspecified atrial fibrillation: Secondary | ICD-10-CM | POA: Diagnosis not present

## 2023-09-07 MED ORDER — ATENOLOL 50 MG PO TABS: ORAL_TABLET | ORAL | 2 refills | Status: DC

## 2023-09-07 NOTE — Assessment & Plan Note (Signed)
With her having 17 and weakness, I would have her drop her morning dose of atenolol to 25 mg daily.  If she were to feel itself Morvesin A-fib would take the full 50.

## 2023-09-07 NOTE — Assessment & Plan Note (Signed)
Unwilling to take statin.  Does not want to address given her advanced age.

## 2023-09-07 NOTE — Assessment & Plan Note (Signed)
Not sure if her hematuria is from vaginitis or UTI.  Monitored by PCP.  Due to see urology.  Question if she is also seeing gynecology.

## 2023-09-07 NOTE — Assessment & Plan Note (Signed)
Patient reports urinary symptoms and intermittent bleeding, possibly due to urinary tract infection. -Continue current treatment plan with urologist. -Do not hold Eliquis prior to urology visit to aid in identifying potential bleeding source. -If cystoscopy is needed, hold Eliquis for 2-3 days prior to procedure.

## 2023-09-07 NOTE — Assessment & Plan Note (Signed)
Due to see urology.  For now it is okay to hold Eliquis for 2 to 3 days if there is significant bleeding, but would otherwise try to keep her on DOAC.  If it becomes too much of an issue, then she would have to be agreeable to stop it which means the increasing risk of stroke.Marland Kitchen

## 2023-09-07 NOTE — Assessment & Plan Note (Signed)
Patient is experiencing fatigue and weakness, possibly due to high dose of Atenolol. Patient is in Afib 45% of the time, with a CHA2DS2-VASc score of 7, indicating a high risk of stroke (11% annually). Patient is currently on Eliquis for stroke prevention and Atenolol for rate control. Patient has intermittent bleeding, possibly related to urinary tract infection. -Reduce morning dose of Atenolol to 25mg , maintain evening dose at 50mg . -Continue Eliquis, but hold for 2-3 days during bleeding episodes. -Consider use of heart rate monitoring app to guide Atenolol dosing. -Refer to electrophysiologist (Dr. Lalla Brothers) for consideration of Afib ablation or Watchman device.

## 2023-09-07 NOTE — Patient Instructions (Signed)
Medication Instructions:  HOLD Eliquis 2-3 days to see if bleeding improves, then restart  DECREASE Atenolol to 25 mg in the morning, and 50 mg in the evening.   *If you need a refill on your cardiac medications before your next appointment, please call your pharmacy*  Follow-Up: At [facility], you and your health needs are our priority.  As part of our continuing mission to provide you with exceptional heart care, we have created designated Provider Care Teams.  These Care Teams include your primary Cardiologist (physician) and Advanced Practice Providers (APPs -  Physician Assistants and Nurse Practitioners) who all work together to provide you with the care you need, when you need it.  We recommend signing up for the patient portal called "MyChart".  Sign up information is provided on this After Visit Summary.  MyChart is used to connect with patients for Virtual Visits (Telemedicine).  Patients are able to view lab/test results, encounter notes, upcoming appointments, etc.  Non-urgent messages can be sent to your provider as well.   To learn more about what you can do with MyChart, go to [email or web address].    Your next appointment:   December/January with Dr.Lambert   Other Instructions Let us know if you are able to get [facility] device.

## 2023-09-17 ENCOUNTER — Other Ambulatory Visit: Payer: Self-pay | Admitting: Cardiology

## 2023-09-17 DIAGNOSIS — I4891 Unspecified atrial fibrillation: Secondary | ICD-10-CM

## 2023-09-18 NOTE — Telephone Encounter (Signed)
Prescription refill request for Eliquis received. Indication:afib Last office visit:10/24 Scr:1.09  4/24 Age: 82 Weight:114.9  kg  Prescription refilled

## 2023-09-19 ENCOUNTER — Ambulatory Visit: Payer: Medicare Other | Admitting: Urology

## 2023-09-22 ENCOUNTER — Encounter: Payer: Self-pay | Admitting: Urology

## 2023-09-22 ENCOUNTER — Ambulatory Visit: Payer: Medicare Other | Admitting: Family Medicine

## 2023-09-25 ENCOUNTER — Ambulatory Visit: Payer: Medicare Other | Admitting: Family Medicine

## 2023-09-25 MED ORDER — APIXABAN 5 MG PO TABS: 5.0000 mg | ORAL_TABLET | Freq: Two times a day (BID) | ORAL | 0 refills | Status: DC

## 2023-09-25 NOTE — Progress Notes (Deleted)
      Established patient visit   Patient: Sydney Vaughan   DOB: 01-Jun-1941   82 y.o. Female  MRN: [id number] Visit Date: 09/25/2023  Today's healthcare provider: Ronnald Ramp, MD   No chief complaint on file.  Subjective       Discussed the use of AI scribe software for clinical note transcription with the patient, who gave verbal consent to proceed.  History of Present Illness             Past Medical History:  Diagnosis Date   AMS (altered mental status) 03/22/2022   Anxiety    Atrial fibrillation with rapid ventricular response (HCC) 03/2022   New diagnosis setting of UTI and TIA.-Next seen on monitor.   Depression    History of cardioembolic stroke    History of tobacco abuse    Hyperlipidemia with target LDL less than 70    Unfortunately, not willing to take statin.  Did not start statin that was ordered in the hospital.  She is very fearful of side effects.   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   TIA due to embolism (HCC) 03/2022    Medications: Outpatient Medications Prior to Visit  Medication Sig   atenolol (TENORMIN) 50 MG tablet Take 0.5 tablet (25 mg) in the morning, and 1 tablet (50 mg) in the evening.   clonazePAM (KLONOPIN) 0.5 MG tablet TAKE 1 TABLET BY MOUTH DAILY AS  NEEDED FOR ANXIETY   ELIQUIS 5 MG TABS tablet TAKE 1 TABLET BY MOUTH TWICE  DAILY   sertraline (ZOLOFT) 25 MG tablet TAKE 2 TABLETS BY MOUTH DAILY (Patient taking differently: Take 25 mg by mouth daily.)   No facility-administered medications prior to visit.    Review of Systems  Last metabolic panel Lab Results  Component Value Date   GLUCOSE 104 (H) 02/22/2023   NA 141 02/22/2023   K 4.4 02/22/2023   CL 103 02/22/2023   CO2 23 02/22/2023   BUN 21 02/22/2023   CREATININE 1.09 (H) 02/22/2023   EGFR 51 (L) 02/22/2023   CALCIUM 9.8 02/22/2023   PROT 7.3 02/22/2023   ALBUMIN 4.1  02/22/2023   LABGLOB 3.2 02/22/2023   AGRATIO 1.3 02/22/2023   BILITOT 0.8 02/22/2023   ALKPHOS 102 02/22/2023   AST 12 02/22/2023   ALT 6 02/22/2023   ANIONGAP 7 03/22/2022     {See past labs  Heme  Chem  Endocrine  Serology  Results Review (optional):1}   Objective    There were no vitals taken for this visit. BP Readings from Last 3 Encounters:  09/07/23 119/77  07/25/23 (!) 145/70  03/22/23 100/69   Wt Readings from Last 3 Encounters:  09/07/23 253 lb 6.4 oz (114.9 kg)  07/25/23 244 lb 4.8 oz (110.8 kg)  06/21/23 239 lb (108.4 kg)    {See vitals history (optional):1}      Physical Exam  ***  No results found for any visits on 09/25/23.  Assessment & Plan     Problem List Items Addressed This Visit   None   Assessment and Plan              No follow-ups on file.         Ronnald Ramp, MD  [facility] [phone] (phone) [phone] (fax)  [facility]

## 2023-09-25 NOTE — Addendum Note (Signed)
Addended by: Darene Lamer T on: 09/25/2023 08:46 AM   Modules accepted: Orders

## 2023-10-18 ENCOUNTER — Other Ambulatory Visit: Payer: Self-pay | Admitting: Family Medicine

## 2023-10-18 DIAGNOSIS — F411 Generalized anxiety disorder: Secondary | ICD-10-CM

## 2023-10-18 NOTE — Telephone Encounter (Signed)
Requested medication (s) are due for refill today: yes  Requested medication (s) are on the active medication list: yes  Last refill:  08/28/23 #30/0  Future visit scheduled: yes  Notes to clinic:  Unable to refill per protocol, cannot delegate.    Requested Prescriptions  Pending Prescriptions Disp Refills   clonazePAM (KLONOPIN) 0.5 MG tablet [Pharmacy Med Name: clonazePAM 0.5 MG Oral Tablet] 30 tablet     Sig: TAKE 1 TABLET BY MOUTH DAILY AS  NEEDED FOR ANXIETY     Not Delegated - Psychiatry: Anxiolytics/Hypnotics 2 Failed - 10/18/2023  5:34 PM      Failed - This refill cannot be delegated      Failed - Urine Drug Screen completed in last 360 days      Passed - Patient is not pregnant      Passed - Valid encounter within last 6 months    Recent Outpatient Visits           2 months ago Atrophic vaginitis   [place] [facility], Tawanna Cooler, MD   7 months ago Anxiety state   [place] [facility], [place], MD   7 months ago SOBOE (shortness of breath on exertion)   [place] [facility], [place], MD   8 months ago Encounter to establish care   [facility], Tawanna Cooler, MD       Future Appointments             In 1 week Vanna Scotland, MD [facility] [place]   In 2 weeks [facility], Tawanna Cooler, MD [facility]

## 2023-10-24 ENCOUNTER — Ambulatory Visit: Payer: Medicare Other | Admitting: Family Medicine

## 2023-10-25 ENCOUNTER — Ambulatory Visit (INDEPENDENT_AMBULATORY_CARE_PROVIDER_SITE_OTHER): Payer: Medicare Other | Admitting: Urology

## 2023-10-25 VITALS — BP 151/65 | HR 51 | Ht 66.0 in | Wt 233.0 lb

## 2023-10-25 DIAGNOSIS — N939 Abnormal uterine and vaginal bleeding, unspecified: Secondary | ICD-10-CM

## 2023-10-25 DIAGNOSIS — R3129 Other microscopic hematuria: Secondary | ICD-10-CM

## 2023-10-25 DIAGNOSIS — R350 Frequency of micturition: Secondary | ICD-10-CM

## 2023-10-25 DIAGNOSIS — R300 Dysuria: Secondary | ICD-10-CM | POA: Diagnosis not present

## 2023-10-25 DIAGNOSIS — N3091 Cystitis, unspecified with hematuria: Secondary | ICD-10-CM

## 2023-10-25 LAB — MICROSCOPIC EXAMINATION: WBC, UA: >30 /[HPF] — AB (ref 0–5)

## 2023-10-25 LAB — URINALYSIS, COMPLETE
Bilirubin, UA: NEGATIVE
Glucose, UA: NEGATIVE
Ketones, UA: NEGATIVE
Nitrite, UA: NEGATIVE
Specific Gravity, UA: 1.015 (ref 1.005–1.030)
Urobilinogen, Ur: 1 mg/dL (ref 0.2–1.0)
pH, UA: 5.5 (ref 5.0–7.5)

## 2023-10-25 NOTE — Progress Notes (Incomplete)
Sydney Vaughan,acting as a scribe for Vanna Scotland, MD.,have documented all relevant documentation on the behalf of Vanna Scotland, MD,as directed by  Vanna Scotland, MD while in the presence of Vanna Scotland, MD.  10/25/23 4:12 PM   Sydney Vaughan 10-20-1941 [id number]  Referring provider: Jacky Kindle, FNP [address] [place],  [place] [address]  Chief Complaint  Patient presents with   Establish Care   Hematuria    HPI: 82 year-old female who presents today for further evaluation of hematuria. Her referral was placed several months ago.   She called her cardiologist back in September, indicating that she was diagnosed with a UTI. She was taking an antibiotic. We do not have any records of this. She also has vaginal bleeding. She is on Eliquis and they held her Eliquis for about 5 days. When she was diagnosed with the UTI, it does not appear that she had a urinalysis associated with this.    Today, her urinalysis has >30 WBC, 11-30 WBC, nitrate negative.  She reports that she started having bleeding after she started on Eliquis. She experiences dysuria and can predict when blood will appear in the urine. She has a history of ovarian cancer and underwent a hysterectomy years ago, likely including removal of the cervix. She used estrogen cream but stopped after reading the insert warning against use with vaginal bleeding. She reports vaginal dryness and irritation.   PMH: Past Medical History:  Diagnosis Date   AMS (altered mental status) 03/22/2022   Anxiety    Atrial fibrillation with rapid ventricular response (HCC) 03/2022   New diagnosis setting of UTI and TIA.-Next seen on monitor.   Depression    History of cardioembolic stroke    History of tobacco abuse    Hyperlipidemia with target LDL less than 70    Unfortunately, not willing to take statin.  Did not start statin that was ordered in the hospital.  She is very fearful of side effects.   Obesity    Obesity  (BMI 35.0-39.9 without comorbidity)    Ovarian cancer (HCC)    BRCA1 carrier-s/p bilateral mastectomy   TIA due to embolism (HCC) 03/2022    Surgical History: Past Surgical History:  Procedure Laterality Date   BREAST SURGERY     TOTAL MASTECTOMY     Left in 1986, Right in 1994    Home Medications:  Allergies as of 10/25/2023   No Known Allergies      Medication List        Accurate as of October 25, 2023  4:12 PM. If you have any questions, ask your nurse or doctor.          atenolol 50 MG tablet Commonly known as: TENORMIN Take 0.5 tablet (25 mg) in the morning, and 1 tablet (50 mg) in the evening.   clonazePAM 0.5 MG tablet Commonly known as: KLONOPIN TAKE 1 TABLET BY MOUTH DAILY AS  NEEDED FOR ANXIETY   Eliquis 5 MG Tabs tablet Generic drug: apixaban TAKE 1 TABLET BY MOUTH TWICE  DAILY   sertraline 25 MG tablet Commonly known as: ZOLOFT TAKE 2 TABLETS BY MOUTH DAILY What changed: how much to take        Family History: Family History  Problem Relation Age of Onset   Heart attack Mother    Cancer Father        colon   Stroke Father    Cancer Sister  ovarian   Cancer Daughter        ovarian   Cancer Daughter        Breast   Cancer Maternal Grandmother        breast    Social History:  reports that she has quit smoking. Her smoking use included cigarettes. She has never used smokeless tobacco. She reports that she does not currently use alcohol. She reports that she does not use drugs.   Physical Exam: BP (!) 151/65   Pulse (!) 51   Ht 5\' 6"  (1.676 m)   Wt 233 lb (105.7 kg)   BMI 37.61 kg/m   Constitutional:  Alert and oriented, No acute distress. HEENT: [place] AT, moist mucus membranes.  Trachea midline, no masses. Neurologic: Grossly intact, no focal deficits, moving all 4 extremities. Psychiatric: Normal mood and affect.   Assessment & Plan:    1. Hemorrhagic cystitis - She does have some dysuria and her urinalysis appears  suspicious - Culture and treat as needed, like a vaginal contaminant - Plan for a pelvic exam and catheterized urine in early January  2. Possible recurrent UTI - Agree with topical estrogen cream - Would recommend that she just use a small amount, pea-sized for the urethra not vaginally  3. Possible vaginal bleeding - Likely due to atrophy and/or excoriation - Only started after she started using estrogen cream on Eliquis - We will do a pelvic exam at her next visit - Presumably, she has no cervix, uterus, or ovaries, so unlikely to represent any significant malignant pathology, but we will assess further   Return in about 1 month (around 11/25/2023) for pelvic exam and repeat UA via catheterization.  I have reviewed the above documentation for accuracy and completeness, and I agree with the above.   Vanna Scotland, MD   [facility] [address] [place], [place] [address] [phone]

## 2023-10-26 ENCOUNTER — Other Ambulatory Visit: Payer: Self-pay | Admitting: Family Medicine

## 2023-10-26 DIAGNOSIS — F411 Generalized anxiety disorder: Secondary | ICD-10-CM

## 2023-10-26 MED ORDER — CLONAZEPAM 0.5 MG PO TABS
0.5000 mg | ORAL_TABLET | Freq: Every day | ORAL | 0 refills | Status: DC | PRN
Start: 2023-10-26 — End: 2023-11-24

## 2023-10-26 NOTE — Telephone Encounter (Signed)
Received fax from [facility] wanting refill authorizations for Clonazepam

## 2023-10-28 LAB — CULTURE, URINE COMPREHENSIVE

## 2023-10-30 ENCOUNTER — Other Ambulatory Visit: Payer: Self-pay

## 2023-10-30 MED ORDER — NITROFURANTOIN MONOHYD MACRO 100 MG PO CAPS: 100.0000 mg | ORAL_CAPSULE | Freq: Two times a day (BID) | ORAL | 0 refills | Status: DC

## 2023-11-02 ENCOUNTER — Ambulatory Visit: Payer: Medicare Other | Admitting: Family Medicine

## 2023-11-21 ENCOUNTER — Other Ambulatory Visit: Payer: Self-pay | Admitting: Family Medicine

## 2023-11-21 DIAGNOSIS — F411 Generalized anxiety disorder: Secondary | ICD-10-CM

## 2023-11-23 ENCOUNTER — Ambulatory Visit: Payer: Medicare Other | Admitting: Physician Assistant

## 2023-11-27 ENCOUNTER — Telehealth: Payer: Self-pay | Admitting: Cardiology

## 2023-11-27 ENCOUNTER — Ambulatory Visit: Payer: Medicare Other | Admitting: Physician Assistant

## 2023-11-27 NOTE — Telephone Encounter (Signed)
 There really are not any other 2 options that are necessarily less noticeable although potentially we can switch to Pradaxa or Savaysa. Otherwise, doing patient assistance is the best option.   Warfarin is cheaper - but is difficult b/c of need for monitoring, finger sticks & f/u visits. Also medication & food interactions.  DH

## 2023-11-27 NOTE — Telephone Encounter (Signed)
 Pt c/o medication issue:  1. Name of Medication: ELIQUIS  5 MG TABS tablet   2. How are you currently taking this medication (dosage and times per day)?   3. Are you having a reaction (difficulty breathing--STAT)?   4. What is your medication issue? Patient is requesting call back to discuss this medication being changed to warfarin. Please advise. States she only has a week left.

## 2023-11-27 NOTE — Telephone Encounter (Signed)
 Patient reports not being able to afford the Eliquis  and wanted to switch to warfarin. Reviewed process of warfarin and she then does not want to do that either. Inquired if she had applied for assistance and she has and was denied. Offered to try that again but she just wants something else that is cheaper. Advised that I would send this over to provider for his review and recommendations.

## 2023-11-28 NOTE — Telephone Encounter (Signed)
 Called pharmacy pradaxa and savaysa are not covered by her insurance. Pharmacist states that patient needs to meet $255.00 deductible and it will then cost her $141.00 a month for her Eliquis . She also recommended that she apply for assistance throught S[employer]. Will reach back out to patient with this information.

## 2023-11-28 NOTE — Telephone Encounter (Signed)
 Spoke with patient and reviewed what her insurance pharmacy quoted. Patient states that she will call them to see if she can get through that deductible with payments until she gets to the $141.00 for 90 day supply. She was thankful for the help and had no further needs at this time.

## 2023-11-28 NOTE — Telephone Encounter (Signed)
 Left voicemail message to call back for review of information with her pharmacy.

## 2023-11-28 NOTE — Telephone Encounter (Signed)
 Spoke with patient and reviewed provider review and recommendations. Looked both medications up on GoodRx and the cheapest at $69.00 is for the pradaxa for 30 day supply. Patient states that something was cheaper with her pharmacy but can't remember the name and requested that I call her pharmacy.

## 2023-12-04 ENCOUNTER — Telehealth: Payer: Self-pay | Admitting: Urology

## 2023-12-05 ENCOUNTER — Encounter: Payer: Self-pay | Admitting: Physician Assistant

## 2023-12-05 ENCOUNTER — Ambulatory Visit: Payer: Medicare Other | Admitting: Physician Assistant

## 2023-12-05 VITALS — Ht 66.0 in | Wt 233.0 lb

## 2023-12-05 DIAGNOSIS — N939 Abnormal uterine and vaginal bleeding, unspecified: Secondary | ICD-10-CM | POA: Diagnosis not present

## 2023-12-05 DIAGNOSIS — R350 Frequency of micturition: Secondary | ICD-10-CM

## 2023-12-05 NOTE — Progress Notes (Signed)
 12/05/2023 5:47 PM   Sydney Vaughan [birth year] [id number]  CC: Chief Complaint  Patient presents with   Follow-up    CATH UA and PELVIC EXAM   HPI: Sydney Vaughan is a 83 y.o. 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   Today she reports she continues to see blood on her toilet paper when she wipes and in her pads/liners.  She denies dysuria.  She has been using topical estrogen cream per Dr. Penne.  PMH: Past Medical History:  Diagnosis Date   AMS (altered mental status) 03/22/2022   Anxiety    Atrial fibrillation with rapid ventricular response (HCC) 03/2022   New diagnosis setting of UTI and TIA.-Next seen on monitor.   Depression    History of cardioembolic stroke    History of tobacco abuse    Hyperlipidemia with target LDL less than 70    Unfortunately, not willing to take statin.  Did not start statin that was ordered in the hospital.  She is very fearful of side effects.   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 If you have any questions, ask your nurse or doctor.          atenolol  50 MG tablet Commonly known as: TENORMIN  Take 0.5 tablet (25 mg) in the morning, and 1 tablet (50 mg) in the evening.   clonazePAM  0.5 MG tablet Commonly known as: KLONOPIN  TAKE 1 TABLET BY MOUTH DAILY AS  NEEDED FOR ANXIETY   Eliquis  5 MG Tabs  tablet Generic drug: apixaban  TAKE 1 TABLET BY MOUTH TWICE  DAILY   nitrofurantoin  (macrocrystal-monohydrate) 100 MG capsule Commonly known as: MACROBID  Take 1 capsule (100 mg total) by mouth 2 (two) times daily.   sertraline  25 MG tablet Commonly known as: ZOLOFT  TAKE 2 TABLETS BY MOUTH DAILY What changed: how much to take        Allergies:  No Known Allergies  Family History: Family History  Problem Relation Age of Onset   Heart attack Mother    Cancer Father        colon   Stroke Father    Cancer Sister        ovarian   Cancer Daughter        ovarian   Cancer Daughter        Breast   Cancer Maternal Grandmother        breast    Social History:   reports that she has quit smoking. Her smoking use included cigarettes. She has never used smokeless tobacco. She reports that she does not currently use alcohol. She reports that she does not use drugs.  Physical Exam: Ht 5' 6 (1.676 m)   Wt 233 lb (105.7 kg)   BMI 37.61 kg/m  Constitutional:  Alert and oriented, no acute distress, nontoxic appearing HEENT: [place], AT Cardiovascular: No clubbing, cyanosis, or edema Respiratory: Normal respiratory effort, no increased work of breathing GU: Sessile, friable lesion at the right vaginal wall near the introitus Skin: Small circular wounds throughout the BLEs and RUE in various stages of healing (patient admits to skin picking) Neurologic: Grossly intact, no focal deficits, moving all 4 extremities Psychiatric: Normal mood and affect  Assessment & Plan:   1. Vaginal bleeding (Primary) On physical exam today, I noted a sessile, friable lesion of the right vaginal wall.  This began to bleed when I touched it with the speculum such that I was not going to be able to obtain a catheterized urine specimen without blood contamination, so I elected not to proceed with cath UA today.  Given her oncologic history, I am referring her to GYN for further evaluation of this lesion, as I  strongly suspect this is the source of the bleeding.  She expressed understanding. - Ambulatory referral to Obstetrics / Gynecology  2. Urinary frequency Continue topical vaginal estrogen cream.  Return if symptoms worsen or fail to improve.  Lucie Hones, PA-C  [facility] [place] [address], [address] [place], [place] [address] [phone]

## 2023-12-12 ENCOUNTER — Telehealth: Payer: Self-pay | Admitting: Physician Assistant

## 2023-12-12 NOTE — Telephone Encounter (Signed)
Patient called a little confused about the appt she had on 1/14. She thought someone was going to call in her an antibiotic for vaginal bleeding. When she called her pharmacy, which is [facility], they do not have anything called in for her. She has already scheduled something  with her GYN and still using the cream.

## 2023-12-12 NOTE — Telephone Encounter (Signed)
No, I did not prescribe an antibiotic. I'd like her to continue the estrogen cream and keep plans for follow up with GYN.

## 2023-12-13 NOTE — Telephone Encounter (Signed)
3rd attempt to reach pt. Called [place], phone call kept ringing, able to leave a message.

## 2023-12-13 NOTE — Telephone Encounter (Signed)
1 st attempt to reach pt, unable to call mobile number, call won't go through  2nd attempt pt reach pt's son, says it's not in service when I tried called

## 2023-12-16 ENCOUNTER — Other Ambulatory Visit: Payer: Self-pay | Admitting: Family Medicine

## 2023-12-16 DIAGNOSIS — F411 Generalized anxiety disorder: Secondary | ICD-10-CM

## 2023-12-19 ENCOUNTER — Ambulatory Visit: Payer: Medicare Other | Admitting: Cardiology

## 2023-12-20 ENCOUNTER — Institutional Professional Consult (permissible substitution): Payer: Medicare Other | Admitting: Cardiology

## 2024-01-10 ENCOUNTER — Encounter: Payer: Medicare Other | Admitting: Obstetrics and Gynecology

## 2024-01-11 ENCOUNTER — Other Ambulatory Visit: Payer: Self-pay | Admitting: Family Medicine

## 2024-01-11 DIAGNOSIS — F411 Generalized anxiety disorder: Secondary | ICD-10-CM

## 2024-01-12 NOTE — Telephone Encounter (Signed)
Requested medication (s) are due for refill today: Yes  Requested medication (s) are on the active medication list: Yes  Last refill:  12/22/23, #30, 0 refills  Future visit scheduled: No  Notes to clinic:  Unable to refill per protocol, cannot delegate.      Requested Prescriptions  Pending Prescriptions Disp Refills   clonazePAM (KLONOPIN) 0.5 MG tablet [Pharmacy Med Name: clonazePAM 0.5 MG Oral Tablet] 30 tablet     Sig: TAKE 1 TABLET BY MOUTH DAILY AS  NEEDED FOR ANXIETY     Not Delegated - Psychiatry: Anxiolytics/Hypnotics 2 Failed - 01/12/2024 12:11 PM      Failed - This refill cannot be delegated      Failed - Urine Drug Screen completed in last 360 days      Passed - Patient is not pregnant      Passed - Valid encounter within last 6 months    Recent Outpatient Visits           5 months ago Atrophic vaginitis   [place] [facility] Simmons-Robinson, Tawanna Cooler, MD   9 months ago Anxiety state   [place] [facility] Simmons-Robinson, [place], MD   10 months ago SOBOE (shortness of breath on exertion)   [place] [facility] Simmons-Robinson, [place], MD   11 months ago Encounter to establish care   [place] [facility] [place], Tawanna Cooler, MD

## 2024-01-19 NOTE — Telephone Encounter (Signed)
Error
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
## 2024-01-26 ENCOUNTER — Encounter: Payer: Medicare Other | Admitting: Obstetrics and Gynecology

## 2024-01-31 ENCOUNTER — Other Ambulatory Visit: Payer: Self-pay | Admitting: Family Medicine

## 2024-01-31 ENCOUNTER — Encounter: Admitting: Obstetrics and Gynecology

## 2024-01-31 DIAGNOSIS — F411 Generalized anxiety disorder: Secondary | ICD-10-CM

## 2024-02-01 NOTE — Telephone Encounter (Signed)
 Requested medication (s) are due for refill today: Yes  Requested medication (s) are on the active medication list: Yes  Last refill:  01/12/24  Future visit scheduled: No  Notes to clinic:  Not delegated.    Requested Prescriptions  Pending Prescriptions Disp Refills   clonazePAM (KLONOPIN) 0.5 MG tablet [Pharmacy Med Name: clonazePAM 0.5 MG Oral Tablet] 30 tablet     Sig: TAKE 1 TABLET BY MOUTH DAILY AS  NEEDED FOR ANXIETY     Not Delegated - Psychiatry: Anxiolytics/Hypnotics 2 Failed - 02/01/2024  9:57 AM      Failed - This refill cannot be delegated      Failed - Urine Drug Screen completed in last 360 days      Failed - Valid encounter within last 6 months    Recent Outpatient Visits           6 months ago Atrophic vaginitis   [place] [facility] Simmons-Robinson, Tawanna Cooler, MD   10 months ago Anxiety state   [facility] Simmons-Robinson, [place], MD   11 months ago SOBOE (shortness of breath on exertion)   [place] [facility] Simmons-Robinson, Tawanna Cooler, MD   1 year ago Encounter to establish care   [place] [facility] [place], Tawanna Cooler, MD              Passed - Patient is not pregnant

## 2024-02-06 ENCOUNTER — Other Ambulatory Visit: Payer: Self-pay | Admitting: Family Medicine

## 2024-02-06 DIAGNOSIS — F411 Generalized anxiety disorder: Secondary | ICD-10-CM

## 2024-02-06 NOTE — Telephone Encounter (Signed)
 Copied from CRM [id number]. Topic: Clinical - Medication Refill >> Feb 06, 2024  5:01 PM Shon Hale wrote: Most Recent Primary Care Visit:  Provider: Ronnald Ramp  Department: [id number]  Visit Type: OFFICE VISIT  Date: 07/25/2023  Medication: clonazePAM (KLONOPIN) 0.5 MG tablet Patient requesting a 90 day supply so that insurance may cover it and requesting it be sent to pharmacy below.   Has the patient contacted their pharmacy? Yes Told to contact the office as they have attempted to reach out with no response.   Is this the correct pharmacy for this prescription? Yes  This is the patient's preferred pharmacy:  [facility] [place] [place] - [address] [place] [address] Phone: [phone] Fax: [phone]   Has the prescription been filled recently? No  Is the patient out of the medication? No  Has the patient been seen for an appointment in the last year OR does the patient have an upcoming appointment? Yes  Can we respond through MyChart? No  Agent: Please be advised that Rx refills may take up to 3 business days. We ask that you follow-up with your pharmacy.

## 2024-02-07 NOTE — Telephone Encounter (Signed)
 Pt called in, wanted to add request for 90 day supply instead of just 30 days,  because then it wont be a cost to her

## 2024-02-08 MED ORDER — CLONAZEPAM 0.5 MG PO TABS
0.5000 mg | ORAL_TABLET | Freq: Every day | ORAL | 0 refills | Status: DC | PRN
Start: 2024-02-08 — End: 2024-03-04

## 2024-02-08 NOTE — Telephone Encounter (Signed)
 Requested medication (s) are due for refill today - yes  Requested medication (s) are on the active medication list -yes  Future visit scheduled -no  Last refill: 01/12/24 #30  Notes to clinic: non delegated Rx, patient requesting 90 day supply  Requested Prescriptions  Pending Prescriptions Disp Refills   clonazePAM (KLONOPIN) 0.5 MG tablet 30 tablet 0    Sig: Take 1 tablet (0.5 mg total) by mouth daily as needed. for anxiety     Not Delegated - Psychiatry: Anxiolytics/Hypnotics 2 Failed - 02/08/2024  8:34 AM      Failed - This refill cannot be delegated      Failed - Urine Drug Screen completed in last 360 days      Failed - Valid encounter within last 6 months    Recent Outpatient Visits           6 months ago Atrophic vaginitis   [place] [facility] Simmons-Robinson, [place], MD   10 months ago Anxiety state   [place] [facility] Simmons-Robinson, [place], MD   11 months ago SOBOE (shortness of breath on exertion)   [place] [facility] Simmons-Robinson, [place], MD   1 year ago Encounter to establish care   [place] [facility] [place], [place], MD              Passed - Patient is not pregnant         Requested Prescriptions  Pending Prescriptions Disp Refills   clonazePAM (KLONOPIN) 0.5 MG tablet 30 tablet 0    Sig: Take 1 tablet (0.5 mg total) by mouth daily as needed. for anxiety     Not Delegated - Psychiatry: Anxiolytics/Hypnotics 2 Failed - 02/08/2024  8:34 AM      Failed - This refill cannot be delegated      Failed - Urine Drug Screen completed in last 360 days      Failed - Valid encounter within last 6 months    Recent Outpatient Visits           6 months ago Atrophic vaginitis   [place] [facility] Simmons-Robinson, [place], MD   10 months ago Anxiety state   [place] [facility] Simmons-Robinson, [place], MD   11 months  ago SOBOE (shortness of breath on exertion)   [place] [facility] Simmons-Robinson, Tawanna Cooler, MD   1 year ago Encounter to establish care    [facility] [place], Tawanna Cooler, MD              Passed - Patient is not pregnant

## 2024-02-08 NOTE — Telephone Encounter (Signed)
   Notes to clinic: Duplicate request- see previous, non delegated Rx  Requested Prescriptions  Pending Prescriptions Disp Refills   clonazePAM (KLONOPIN) 0.5 MG tablet 30 tablet 0    Sig: Take 1 tablet (0.5 mg total) by mouth daily as needed. for anxiety     Not Delegated - Psychiatry: Anxiolytics/Hypnotics 2 Failed - 02/08/2024  8:48 AM      Failed - This refill cannot be delegated      Failed - Urine Drug Screen completed in last 360 days      Failed - Valid encounter within last 6 months    Recent Outpatient Visits           6 months ago Atrophic vaginitis   [facility] Simmons-Robinson, [place], MD   10 months ago Anxiety state   [facility] Simmons-Robinson, [place], MD   11 months ago SOBOE (shortness of breath on exertion)   [facility] Simmons-Robinson, [place], MD   1 year ago Encounter to establish care   [facility] [place], Neibert, MD              Passed - Patient is not pregnant         Requested Prescriptions  Pending Prescriptions Disp Refills   clonazePAM (KLONOPIN) 0.5 MG tablet 30 tablet 0    Sig: Take 1 tablet (0.5 mg total) by mouth daily as needed. for anxiety     Not Delegated - Psychiatry: Anxiolytics/Hypnotics 2 Failed - 02/08/2024  8:48 AM      Failed - This refill cannot be delegated      Failed - Urine Drug Screen completed in last 360 days      Failed - Valid encounter within last 6 months    Recent Outpatient Visits           6 months ago Atrophic vaginitis   [facility] Simmons-Robinson, [place], MD   10 months ago Anxiety state   [facility] Simmons-Robinson, [place], MD   11 months ago SOBOE (shortness of breath on exertion)   [facility] Simmons-Robinson, Tawanna Cooler, MD   1 year ago Encounter to establish care   [place]  [facility] [place], Tawanna Cooler, MD              Passed - Patient is not pregnant

## 2024-02-14 ENCOUNTER — Other Ambulatory Visit: Payer: Self-pay

## 2024-02-14 DIAGNOSIS — F411 Generalized anxiety disorder: Secondary | ICD-10-CM

## 2024-02-14 NOTE — Telephone Encounter (Signed)
 Copied from CRM [id number]. Topic: Clinical - Prescription Issue >> Feb 14, 2024  2:41 PM Victorino Dike T wrote: Reason for CRM: clonazePAM (KLONOPIN) 0.5 MG tablet- was asking for 90 day refill but received 30 day, wou ld like the next refill to 90 day so she does not have to pay anything.

## 2024-02-15 ENCOUNTER — Other Ambulatory Visit: Payer: Self-pay

## 2024-03-04 MED ORDER — CLONAZEPAM 0.5 MG PO TABS
0.5000 mg | ORAL_TABLET | Freq: Every day | ORAL | 0 refills | Status: DC | PRN
Start: 2024-03-08 — End: 2024-04-09

## 2024-03-04 NOTE — Telephone Encounter (Signed)
 [facility] faxed refill request for the following medications:   clonazePAM (KLONOPIN) 0.5 MG tablet    Please advise.

## 2024-03-04 NOTE — Telephone Encounter (Signed)
 LOV 9*24*24 NOV 8*6*25 LRF [id number] LABS 4*3*24

## 2024-03-13 ENCOUNTER — Encounter: Admitting: Certified Nurse Midwife

## 2024-03-20 ENCOUNTER — Ambulatory Visit: Payer: Self-pay | Admitting: *Deleted

## 2024-03-20 NOTE — Telephone Encounter (Signed)
 Chief Complaint: generalized weakness and mobility issues worsening. Requesting home health aide and therapy  Symptoms: fell or slid down edge of sofa greater than 1 week ago with son helping her to walk. Required EMS to come help her up. Since then patient has required max assist to stand using adaptive equipment walking. Using incontinent aides due to inability to get up without assist to go to bathroom. Can feed herself . Requires assist to turn or do any mobility Frequency: greater than a week  Pertinent Negatives: Patient denies chest pain no difficulty breathing no fever reported . No injury from fall no skin issues reported. Disposition: [] ED /[] Urgent Care (no appt availability in office) / [] Appointment(In office/virtual)/ []  [place] [facility]/ [] Home Care/ [] Refused Recommended Disposition /[] [place] [facility]/ [x]  Follow-up with PCP Additional Notes:   Patient reports she can not make it in to do OV.  No my chart access. Patient requesting if PCP can order home health to assist with care due to her son can not continue to care for her 24/7. Requesting therapy as well. Recommended if she can not get up even with son's help call 911 for assist. Please advise.     Copied from CRM [id number]. Topic: Clinical - Medical Advice >> Mar 20, 2024 12:46 PM Crispin Dolphin wrote: Reason for CRM: Patient called states she is not able to get up. She called and EMS came about a week or so ago and got her up but she has not been able to get up since. Not able to go to rest room or come to doctor. Son has been helping some but is not able and she would like to see if provider can send someone to her home to help her. States she spoke with insurance and they say they do not cover home health but she says she needs someone to exercise her and help her out. Thank You Reason for Disposition  [1] MODERATE weakness (i.e., interferes with work, school, normal activities) AND [2] cause unknown  (Exceptions:  Weakness from acute minor illness or poor fluid intake; weakness is chronic and not worse.)    Moderate to severe weakness without max assist to stand  Answer Assessment - Initial Assessment Questions 1. DESCRIPTION: "Describe how you are feeling."     Weakness in legs unable to stand without max assist from son 2. SEVERITY: "How bad is it?"  "Can you stand and walk?"   - MILD (0-3): Feels weak or tired, but does not interfere with work, school or normal activities.   - MODERATE (4-7): Able to stand and walk; weakness interferes with work, school, or normal activities.   - SEVERE (8-10): Unable to stand or walk; unable to do usual activities.     severe 3. ONSET: "When did these symptoms begin?" (e.g., hours, days, weeks, months)     Greater than 1 week ago  4. CAUSE: "What do you think is causing the weakness or fatigue?" (e.g., not drinking enough fluids, medical problem, trouble sleeping)     Decrease strength and mobility  5. NEW MEDICINES:  "Have you started on any new medicines recently?" (e.g., opioid pain medicines, benzodiazepines, muscle relaxants, antidepressants, antihistamines, neuroleptics, beta blockers)     na 6. OTHER SYMPTOMS: "Do you have any other symptoms?" (e.g., chest pain, fever, cough, SOB, vomiting, diarrhea, bleeding, other areas of pain)     Weakness can not stand without maximal assistance to stand . Wears depends for incontinence. Pt son helps at  this time. 7. PREGNANCY: "Is there any chance you are pregnant?" "When was your last menstrual period?"     na  Protocols used: Weakness (Generalized) and Fatigue-A-AH

## 2024-03-21 ENCOUNTER — Telehealth: Payer: Self-pay

## 2024-03-21 NOTE — Telephone Encounter (Signed)
 Please advise patient some type of visit either in office or virtual will be required in order to get assistance requested within the home otherwise, she may need to be admitted to the hospital as I am concerned that has been a new medical development that needs to be addressed and she may need higher level of care (such as skilled nursing facility, long term care) than what can be provided on intermittent basis in the home given her mobility limitations reported.   I will copy message for social work to review and assist as permitted   I agree with EMS call if she is not able to move.

## 2024-03-21 NOTE — Telephone Encounter (Signed)
 Copied from CRM [id number]. Topic: Clinical - Medical Advice >> Mar 21, 2024  8:16 AM Sydney Vaughan wrote: Reason for CRM: patient called stated she is having problems walking and will possibly need someone to come into the house to help her. Please f/u with patient

## 2024-03-26 ENCOUNTER — Telehealth: Payer: Self-pay | Admitting: Family Medicine

## 2024-03-26 NOTE — Telephone Encounter (Signed)
Please advise on what needs to be done

## 2024-03-26 NOTE — Telephone Encounter (Signed)
 Received message regarding concern for vaginal lesion during exam with urology. Patient was referred to GYN but declined to reschedule the appt that was scheduled.   Reports that she continues to have weakness in her legs and has trouble getting up in order to get to the restroom and has been with having accidents with urine.   She states she does not want to leave her home and feels safe there with her son helping her as much as he can.   She voiced understanding regarding the recommendation for sooner follow up with our office for home health orders and the GYN office for the vaginal lesion

## 2024-03-26 NOTE — Telephone Encounter (Signed)
 Please schedule patient for earlier visit, preferably within the next 4 weeks in our office for home health Sydney Vaughan is currently scheduled for a visit in August

## 2024-03-27 NOTE — Telephone Encounter (Signed)
 Spoke to patient and got her set up for an appt on 04/29/24

## 2024-03-27 NOTE — Telephone Encounter (Signed)
 Please see the message from Dr Ardeth Beckers

## 2024-03-30 ENCOUNTER — Other Ambulatory Visit: Payer: Self-pay

## 2024-03-30 ENCOUNTER — Emergency Department

## 2024-03-30 ENCOUNTER — Emergency Department
Admission: EM | Admit: 2024-03-30 | Discharge: 2024-03-30 | Disposition: A | Discharge status: Home / Self Care | Attending: Emergency Medicine | Admitting: Emergency Medicine

## 2024-03-30 DIAGNOSIS — R6889 Other general symptoms and signs: Secondary | ICD-10-CM | POA: Diagnosis not present

## 2024-03-30 DIAGNOSIS — R001 Bradycardia, unspecified: Secondary | ICD-10-CM | POA: Insufficient documentation

## 2024-03-30 DIAGNOSIS — N3000 Acute cystitis without hematuria: Secondary | ICD-10-CM | POA: Diagnosis not present

## 2024-03-30 DIAGNOSIS — I499 Cardiac arrhythmia, unspecified: Secondary | ICD-10-CM | POA: Diagnosis not present

## 2024-03-30 DIAGNOSIS — R531 Weakness: Secondary | ICD-10-CM | POA: Diagnosis not present

## 2024-03-30 DIAGNOSIS — G9389 Other specified disorders of brain: Secondary | ICD-10-CM | POA: Diagnosis not present

## 2024-03-30 LAB — COMPREHENSIVE METABOLIC PANEL WITH GFR
ALT: 8 U/L (ref 0–44)
AST: 12 U/L — ABNORMAL LOW (ref 15–41)
Albumin: 3.4 g/dL — ABNORMAL LOW (ref 3.5–5.0)
Alkaline Phosphatase: 78 U/L (ref 38–126)
Anion gap: 8 (ref 5–15)
BUN: 18 mg/dL (ref 8–23)
CO2: 26 mmol/L (ref 22–32)
Calcium: 9 mg/dL (ref 8.9–10.3)
Chloride: 107 mmol/L (ref 98–111)
Creatinine, Ser: 0.93 mg/dL (ref 0.44–1.00)
GFR, Estimated: >60 mL/min (ref >60)
Glucose, Bld: 97 mg/dL (ref 70–99)
Potassium: 3.7 mmol/L (ref 3.5–5.1)
Sodium: 141 mmol/L (ref 135–145)
Total Bilirubin: 0.7 mg/dL (ref 0.0–1.2)
Total Protein: 7 g/dL (ref 6.5–8.1)

## 2024-03-30 LAB — CBC WITH DIFFERENTIAL/PLATELET
Abs Immature Granulocytes: 0.02 10*3/uL (ref 0.00–0.07)
Basophils Absolute: 0 10*3/uL (ref 0.0–0.1)
Basophils Relative: 0 %
Eosinophils Absolute: 0.1 10*3/uL (ref 0.0–0.5)
Eosinophils Relative: 2 %
HCT: 38.4 % (ref 36.0–46.0)
Hemoglobin: 12.3 g/dL (ref 12.0–15.0)
Immature Granulocytes: 0 %
Lymphocytes Relative: 19 %
Lymphs Abs: 1.3 10*3/uL (ref 0.7–4.0)
MCH: 31.6 pg (ref 26.0–34.0)
MCHC: 32 g/dL (ref 30.0–36.0)
MCV: 98.7 fL (ref 80.0–100.0)
Monocytes Absolute: 1.2 10*3/uL — ABNORMAL HIGH (ref 0.1–1.0)
Monocytes Relative: 17 %
Neutro Abs: 4.2 10*3/uL (ref 1.7–7.7)
Neutrophils Relative %: 62 %
Platelets: 174 10*3/uL (ref 150–400)
RBC: 3.89 MIL/uL (ref 3.87–5.11)
RDW: 13.4 % (ref 11.5–15.5)
WBC: 6.8 10*3/uL (ref 4.0–10.5)
nRBC: 0 % (ref 0.0–0.2)

## 2024-03-30 LAB — URINALYSIS, W/ REFLEX TO CULTURE (INFECTION SUSPECTED)
Bilirubin Urine: NEGATIVE
Glucose, UA: NEGATIVE mg/dL
Ketones, ur: NEGATIVE mg/dL
Nitrite: NEGATIVE
Protein, ur: 30 mg/dL — AB
RBC / HPF: >50 RBC/hpf (ref 0–5)
Specific Gravity, Urine: 1.024 (ref 1.005–1.030)
pH: 5 (ref 5.0–8.0)

## 2024-03-30 LAB — T4, FREE: Free T4: 0.78 ng/dL (ref 0.61–1.12)

## 2024-03-30 LAB — TSH: TSH: 2.266 u[IU]/mL (ref 0.350–4.500)

## 2024-03-30 LAB — MAGNESIUM: Magnesium: 1.9 mg/dL (ref 1.7–2.4)

## 2024-03-30 LAB — TROPONIN I (HIGH SENSITIVITY): Troponin I (High Sensitivity): 6 ng/L (ref <18)

## 2024-03-30 MED ORDER — CEPHALEXIN 500 MG PO CAPS: 500.0000 mg | ORAL_CAPSULE | Freq: Two times a day (BID) | ORAL | 0 refills | Status: AC

## 2024-03-30 MED ORDER — SODIUM CHLORIDE 0.9 % IV SOLN
1.0000 g | Freq: Once | INTRAVENOUS | Status: AC
  Administered 2024-03-30: 1 g via INTRAVENOUS
  Filled 2024-03-30: qty 10

## 2024-03-30 NOTE — Discharge Instructions (Signed)
 You are seen in the ER today for evaluation of your weakness.  Please use your assist devices when getting around.  Discussed with your primary care doctor on the additional home health options.  Your urine was concerning for a possible infection.  I sent a prescription for antibiotics to your pharmacy.  Please also reach out to your cardiologist to discuss your medications.  Specifically, I recommend taking 25 mg of your atenolol  twice a day unless you are otherwise directed by your cardiologist.  Return to the ER for any new or worsening symptoms.

## 2024-03-30 NOTE — ED Triage Notes (Signed)
 Pt brought in from home by EMS for generalized weakness x 2 weeks. Pt states the weakness has not gotten worse, she is just tired of not being able to get up. She wants therapy to teach her how to be able to get up. She would also like to discuss being able to get a lift chair. Per EMS, they have had several calls to the patient's residence for falls without injury and to assist pt with getting up.

## 2024-03-30 NOTE — ED Provider Notes (Signed)
 [facility] Provider Note    Event Date/Time   First MD Initiated Contact with Patient 03/30/24 1607     (approximate)   History   Weakness   HPI  Sydney Vaughan is a 83 year old female presenting to the emergency department for evaluation of generalized weakness.  Patient reports that at baseline she occasionally uses a cane, but he get around.  About 2 weeks ago she noticed she was having difficulty getting out of her bed, her son has been helping her.  Reports that this is not worsening and actually thinks it may be improving slightly.  Reports sliding out of her couch once, but denies any injuries or other trauma.  She reports that she would like to be taught how she can safely get up.  Denies fevers, chills, numbness, tingling, focal weakness, chest pain, shortness of breath.     Physical Exam   Triage Vital Signs: ED Triage Vitals  Encounter Vitals Group     BP 03/30/24 1615 131/73     Systolic BP Percentile --      Diastolic BP Percentile --      Pulse Rate 03/30/24 1615 (!) 48     Resp 03/30/24 1615 16     Temp 03/30/24 1615 98.3 F (36.8 C)     Temp Source 03/30/24 1615 Oral     SpO2 03/30/24 1615 99 %     Weight 03/30/24 1611 249 lb 9.6 oz (113.2 kg)     Height 03/30/24 1611 5\' 6"  (1.676 m)     Head Circumference --      Peak Flow --      Pain Score 03/30/24 1611 0     Pain Loc --      Pain Education --      Exclude from Growth Chart --     Most recent vital signs: Vitals:   03/30/24 1730 03/30/24 1900  BP: (!) 178/145 98/83  Pulse: (!) 47 (!) 50  Resp: (!) 21   Temp:    SpO2: 100% 100%     General: Awake, interactive  CV:  Bradycardic with regular rhythm, normal peripheral perfusion Resp:  Unlabored respirations, lungs good auscultation Abd:  Nondistended, soft, nontender to palpation Neuro:  Symmetric facial movement, mild generalized weakness of the bilateral lower extremities, 5-5 strength in the bilateral upper  extremities, alert, oriented.   ED Results / Procedures / Treatments   Labs (all labs ordered are listed, but only abnormal results are displayed) Labs Reviewed  CBC WITH DIFFERENTIAL/PLATELET - Abnormal; Notable for the following components:      Result Value   Monocytes Absolute 1.2 (*)    All other components within normal limits  COMPREHENSIVE METABOLIC PANEL WITH GFR - Abnormal; Notable for the following components:   Albumin 3.4 (*)    AST 12 (*)    All other components within normal limits  URINALYSIS, W/ REFLEX TO CULTURE (INFECTION SUSPECTED) - Abnormal; Notable for the following components:   Color, Urine YELLOW (*)    APPearance CLOUDY (*)    Hgb urine dipstick LARGE (*)    Protein, ur 30 (*)    Leukocytes,Ua MODERATE (*)    Bacteria, UA RARE (*)    All other components within normal limits  URINE CULTURE  TSH  T4, FREE  MAGNESIUM  TROPONIN I (HIGH SENSITIVITY)     EKG EKG independently reviewed interpreted by myself (ER attending) demonstrates:  EKG demonstrates sinus rhythm rate of 51, PR  172, QRS 92, QTc 447, no acute ST changes  RADIOLOGY Imaging independently reviewed and interpreted by myself demonstrates:  CT head without acute bleed  Formal Radiology Read:  CT Head Wo Contrast Result Date: 03/30/2024 CLINICAL DATA:  Generalized weakness x2 weeks. EXAM: CT HEAD WITHOUT CONTRAST TECHNIQUE: Contiguous axial images were obtained from the base of the skull through the vertex without intravenous contrast. RADIATION DOSE REDUCTION: This exam was performed according to the departmental dose-optimization program which includes automated exposure control, adjustment of the mA and/or kV according to patient size and/or use of iterative reconstruction technique. COMPARISON:  Mar 21, 2022 FINDINGS: Brain: There is generalized cerebral atrophy with widening of the extra-axial spaces and ventricular dilatation. There are areas of decreased attenuation within the white  matter tracts of the supratentorial brain, consistent with microvascular disease changes. 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 Vascular: No hyperdense vessel or unexpected calcification. Skull: Normal. Negative for fracture or focal lesion. Sinuses/Orbits: No acute finding. Other: None. IMPRESSION: 1. Generalized cerebral atrophy and microvascular disease changes of the supratentorial brain. 2. 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 3. No acute intracranial abnormality. Electronically Signed   By: Virgle Grime M.D.   On: 03/30/2024 19:04    PROCEDURES:  Critical Care performed: No  Procedures   MEDICATIONS ORDERED IN ED: Medications  cefTRIAXone  (ROCEPHIN ) 1 g in sodium chloride  0.9 % 100 mL IVPB (1 g Intravenous New Bag/Given 03/30/24 1901)     IMPRESSION / MDM / ASSESSMENT AND PLAN / ED COURSE  I reviewed the triage vital signs and the nursing notes.  Differential diagnosis includes, but is not limited to, anemia, electrolyte abnormality, UTI, dehydration, thyroid dysfunction, medication adverse effect  Patient's presentation is most consistent with acute presentation with potential threat to life or bodily function.  83 year old female presenting with generalized weakness.  Bradycardic with normal blood pressure on presentation.  Will obtain labs, CT head, urine to further evaluate.  7:14 PM Labs overall reassuring.  Urine is somewhat concerning for infection with moderate leukocyte esterase and 21 to 50 white blood cells.  However, does also note blood likely related to patient's recent vaginal bleeding for which she is following up as an outpatient which may be contributing to her findings.  Will send urine culture, but given recent increased weakness without obvious cause, do think empiric treatment is reasonable.  Do also note that patient is bradycardic with heart rates in the 40s to 50s here.  I did discuss admission in  the setting of this.  Patient reports that she does not wish to be admitted and is adamant that she does not wish to be placed in any care facility. I do note that patient has a history of heart rates in the 50s noted on multiple prior visits.  Heart rate currently in the mid 50s.  She does not appear acutely symptomatic from her bradycardia, so I do think discharge and close outpatient follow-up with her cardiologist is reasonable.  She reports she is currently taking 25 mg of atenolol  in the morning and 50 mg at night, I did recommend she change this to 25 mg twice daily and reach out to her cardiologist for further recommendations.  Patient is comfortable with this plan.  Will give her a dose of IV Rocephin  prior to discharge and DC with Keflex.  Strict return precautions provided.  Patient discharged stable condition.     FINAL CLINICAL IMPRESSION(S) / ED DIAGNOSES  Final diagnoses:  Generalized weakness  Acute cystitis without hematuria  Bradycardia     Rx / DC Orders   ED Discharge Orders          Ordered    cephALEXin (KEFLEX) 500 MG capsule  2 times daily        03/30/24 1914             Note:  This document was prepared using Dragon voice recognition software and may include unintentional dictation errors.   Claria Crofts, MD 03/30/24 [phone]

## 2024-03-31 LAB — URINE CULTURE

## 2024-04-08 ENCOUNTER — Other Ambulatory Visit: Payer: Self-pay | Admitting: Family Medicine

## 2024-04-08 DIAGNOSIS — F411 Generalized anxiety disorder: Secondary | ICD-10-CM

## 2024-04-10 ENCOUNTER — Telehealth: Payer: Self-pay | Admitting: Cardiology

## 2024-04-10 NOTE — Telephone Encounter (Signed)
 Left a message for the patient to call back.

## 2024-04-10 NOTE — Telephone Encounter (Signed)
  Pt c/o medication issue:  1. Name of Medication: atenolol  (TENORMIN ) 50 MG tablet   2. How are you currently taking this medication (dosage and times per day)?   Take 0.5 tablet (25 mg) in the morning, and 1 tablet (50 mg) in the evening.    3. Are you having a reaction (difficulty breathing--STAT)? No   4. What is your medication issue?  The patient said she was in the ED recently, and her atenolol  dose was changed to half in the morning and half in the evening.

## 2024-04-11 ENCOUNTER — Other Ambulatory Visit: Payer: Self-pay

## 2024-04-11 ENCOUNTER — Other Ambulatory Visit

## 2024-04-11 NOTE — Patient Instructions (Signed)
 Visit Information  Thank you for taking time to visit with me today. Please don't hesitate to contact me if I can be of assistance to you before our next scheduled appointment.  Our next appointment is by telephone on May 13, 2024 at 2:45 pm Please do not hesitate to contact me if you require assistance prior to our next outreach.  Following is a copy of your care plan:   Goals Addressed             This Visit's Progress    VBCI RNCM:  Monitor, Self-Manage And Reduce Symptoms of Atrial Fibrillation       Problems:  Chronic Disease Management support and education needs related to Atrial Fibrillation   Goal: Over the next 3 months the patient will: Attend all scheduled medical appointments Take all medications exactly as prescribed  Demonstrate ongoing adherence to prescribed treatment plan for Atrial  Fibrillation Not experience hospital admission for complications related to Atrial Fibrillation  AFIB Interventions:(Status: Goal on track) Long Term Goal  Reviewed current treatment plan, self-management, and adherence to plan as established by provider.  Provided education regarding importance of taking medications as prescribed along with indications for use. Discussed importance of taking anticoagulant exactly as prescribed. Reports taking medications as prescribed. Denies current concerns regarding medication management. Agreed to keep the provider and care team updated of concerns regarding prescription cost. Discussed bleeding risk associated with Eliquis  and importance of self-monitoring for signs/symptoms of bleeding. Reviewed symptoms. Denies chest pain or palpitations. Reports episodes of weakness and shortness of breath with activity. She was evaluated and treated in the Emergency Room for generalized weakness on Mar 30, 2024. Hospital Admission and evaluation for facility rehabilitation was recommended but she declined. Reports ambulating slowly in the home and performing all  ADLs independently. She reports being fearful that she will be placed in a facility and remains rather adamant about staying in her home with her son. Thorough discussion regarding safety and fall prevention measures. Provided education regarding importance of seeking medical attention after a head injury or if there is blood in the urine or stool. Reports several near falls due to weakness. Denies head injury. Reports being followed closely by the Urology and Gynecology team due to episodes of vaginal bleeding/spotting and hematuria. Reports the symptoms started after taking Eliquis . Reviewed worsening symptoms and indications for seeking immediate medical attention.   Patient Self-Care Activities:  Take medications exactly as prescribed Monitor pulse (heart) rate daily Assess symptoms daily Seek medical attention if you experience a head injury or if there is blood in the urine or stool Follow recommended safety and fall prevention measures Attend medical appointments as scheduled Contact the clinic with new symptoms or concerns   Plan:  Telephone follow up appointment scheduled for May 13, 2024          VBCI RNCM: High Risk for Falls       Problems:  Care Coordination needs related to High Fall Risks Generalized Weakness  Goal: Over the next 3 months the patient will: Attend all scheduled medical appointments Take all medications exactly as prescribed  Demonstrate ongoing adherence to treatment plan for Fall Prevention Not experience falls or require hospitalization for fall related injuries.  Falls Interventions:(Status: Goal on track  ) Long Term Goal  Provided written and verbal education re: potential causes of falls and Fall prevention strategies Reviewed medications and discussed potential side effects of medications such as dizziness and frequent urination Advised patient of importance of notifying  provider of falls Assessed for falls since last encounter Assessed  patients knowledge of fall risk prevention secondary to previously provided education Provided education regarding safety and fall prevention strategies. Discussed importance of using walker with all ambulation due to episodes of weakness. Discussed option for Home Health Physical Therapy to assist with strength training and mobility. Patient declined. Reports feeling that her strength is improving. Agreed to update the team if she opts for Physical Therapy. Provided patient information for fall alert systems Screening for signs and symptoms of depression related to chronic disease state  Assessed social determinant of health barriers  Patient Self-Care Activities:  Attend all scheduled provider appointments Call provider office for new concerns or questions  Take medications as prescribed   Follow recommended safety and fall prevention measures   Plan:  Telephone follow up appointment on May 13, 2024             Please call the Suicide and Crisis Lifeline: 988 call 911 if you are experiencing a Mental Health or Behavioral Health Crisis or need someone to talk to.  The patient verbalized understanding of instructions, educational materials, and care plan provided today and DECLINED offer to receive copy of patient instructions, educational materials, and care plan.    Roxie [facility] Population Health RN Care Manager Direct Dial: [phone]  Fax: [phone] Website: [email or web address]

## 2024-04-11 NOTE — Patient Outreach (Unsigned)
 Complex Care Management   Visit Note  04/11/2024  Name:  Sydney Vaughan MRN: [id number] DOB: 1941/08/24  Situation: Referral received for Complex Care Management related to {Criteria:32550} I obtained verbal consent from {CHL AMB Patient/Caregiver:28184}.  Visit completed with ***  {VISIT LOCATION:32553}  Background:   Past Medical History:  Diagnosis Date   AMS (altered mental status) 03/22/2022   Anxiety    Atrial fibrillation with rapid ventricular response (HCC) 03/2022   New diagnosis setting of UTI and TIA.-Next seen on monitor.   Depression    History of cardioembolic stroke    History of tobacco abuse    Hyperlipidemia with target LDL less than 70    Unfortunately, not willing to take statin.  Did not start statin that was ordered in the hospital.  She is very fearful of side effects.   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  Medications Reviewed Today     Reviewed by Roxie Cord, RN (Registered Nurse) on 04/11/24 at 1600  Med List Status: <None>   Medication Order Taking? Sig Documenting Provider Last Dose Status Informant  atenolol  (TENORMIN ) 50 MG tablet [id number]  Take 0.5 tablet (25 mg) in the morning, and 1 tablet (50 mg) in the evening.  Patient not taking: Reported on 12/05/2023   Arleen Lacer, MD  Active            Med Note [facility], [facility]   Thu Apr 11, 2024  3:58 PM) Reports dose  changed to half in the morning and half in the evening  clonazePAM  (KLONOPIN ) 0.5 MG tablet [id number] Yes TAKE 1 TABLET BY MOUTH DAILY AS  NEEDED FOR ANXIETY Simmons-Robinson, Makiera, MD Taking Active   ELIQUIS  5 MG TABS tablet [id number] Yes TAKE 1 TABLET BY MOUTH TWICE  DAILY Arleen Lacer, MD Taking Active   nitrofurantoin , macrocrystal-monohydrate, (MACROBID ) 100 MG capsule [id number] No Take 1 capsule (100 mg total) by mouth 2 (two) times daily.  Patient not taking: Reported on 04/11/2024   Dustin Gimenez, MD Not Taking Active   sertraline  (ZOLOFT ) 25 MG tablet [id number]  TAKE 2 TABLETS BY MOUTH DAILY  Patient taking differently: Take 25 mg by mouth daily.   Mimi Alt, MD  Active            Med Note Dessa Floss, Kierstin January N   Thu Apr 11, 2024  4:00 PM) Reports currently taking one tablet a day            Recommendation:   {RECOMMENDATONS:32554}  Follow Up Plan:   {FOLLOWUP:32559}  SIG ***

## 2024-04-16 NOTE — Telephone Encounter (Signed)
Makes sense  ? ?DH ?

## 2024-04-16 NOTE — Telephone Encounter (Signed)
 Spoke with patient and she reports when she recently went to the ED they decreased her atenolol  to 25 mg in the morning and 25 mg in the evening due to her low heart rates. Advised to continue and that I would update Dr. Addie Holstein. She was appreciative for the call back.

## 2024-04-29 ENCOUNTER — Other Ambulatory Visit: Payer: Self-pay | Admitting: Family Medicine

## 2024-04-29 ENCOUNTER — Ambulatory Visit: Admitting: Family Medicine

## 2024-04-29 DIAGNOSIS — F411 Generalized anxiety disorder: Secondary | ICD-10-CM

## 2024-05-01 NOTE — Telephone Encounter (Signed)
See Rx request ° °

## 2024-05-06 ENCOUNTER — Other Ambulatory Visit: Payer: Self-pay | Admitting: Family Medicine

## 2024-05-06 DIAGNOSIS — F411 Generalized anxiety disorder: Secondary | ICD-10-CM

## 2024-05-10 NOTE — Telephone Encounter (Unsigned)
 Copied from CRM [id number]. Topic: Clinical - Medication Refill >> May 10, 2024  4:08 PM Tiffany S wrote: Medication: clonazePAM  (KLONOPIN ) 0.5 MG tablet [[id number]]  Has the patient contacted their pharmacy? Yes (Agent: If no, request that the patient contact the pharmacy for the refill. If patient does not wish to contact the pharmacy document the reason why and proceed with request.) (Agent: If yes, when and what did the pharmacy advise?)  This is the patient's preferred pharmacy:  [facility] [place] [place] - [address] [place] [address] Phone: [phone] Fax: [phone]  Is this the correct pharmacy for this prescription? Yes If no, delete pharmacy and type the correct one.   Has the prescription been filled recently? Yes  Is the patient out of the medication? Yes  Has the patient been seen for an appointment in the last year OR does the patient have an upcoming appointment? Yes  Can we respond through MyChart? No  Agent: Please be advised that Rx refills may take up to 3 business days. We ask that you follow-up with your pharmacy.

## 2024-05-13 ENCOUNTER — Telehealth: Payer: Self-pay

## 2024-05-13 ENCOUNTER — Other Ambulatory Visit: Payer: Self-pay | Admitting: Family Medicine

## 2024-05-13 DIAGNOSIS — F32A Depression, unspecified: Secondary | ICD-10-CM

## 2024-05-13 DIAGNOSIS — F411 Generalized anxiety disorder: Secondary | ICD-10-CM

## 2024-05-13 NOTE — Telephone Encounter (Signed)
 Pt reports having trouble picking Clonazepam . Pharmacy told pt the doctor's office said pt needs a MD appmt before Clonazepam  can be refilled. Pt explained she already [employer] up in July. Pt states she missed a call earlier from Nurse Triage; however, specialist and nurse cannot find a reason for last placed call in encounters.  Pt requesting if PCP office can let pharmacy know she is already scheduled to see her PCP so she can pick up her clonazepam  order.

## 2024-05-14 ENCOUNTER — Other Ambulatory Visit: Payer: Self-pay

## 2024-05-14 DIAGNOSIS — F411 Generalized anxiety disorder: Secondary | ICD-10-CM

## 2024-05-14 NOTE — Telephone Encounter (Signed)
Converted to refill request and sent to provider °

## 2024-05-14 NOTE — Telephone Encounter (Signed)
 LOV 07/25/23 NOV 06/12/24 LRF 04/09/24 30 x 0

## 2024-05-15 MED ORDER — CLONAZEPAM 0.5 MG PO TABS: 0.5000 mg | ORAL_TABLET | Freq: Every day | ORAL | 2 refills | Status: DC | PRN

## 2024-05-25 ENCOUNTER — Other Ambulatory Visit: Payer: Self-pay | Admitting: Urology

## 2024-06-05 ENCOUNTER — Encounter: Payer: Self-pay | Admitting: Certified Nurse Midwife

## 2024-06-05 NOTE — Progress Notes (Deleted)
    GYNECOLOGY PROGRESS NOTE  Subjective:    Patient ID: Sydney Vaughan, female    DOB: [birth year], 83 y.o.   MRN: [id number]  HPI  Patient is a 83 y.o. No obstetric history on file. female who presents for postmenopausal bleeding since starting blood thinners.  Patient is on Eliquis  due to hx of Afib, stroke and TIA.    {Common ambulatory SmartLinks:19316}  Review of Systems {ros; complete:30496}   Objective:   There were no vitals taken for this visit. There is no height or weight on file to calculate BMI. General appearance: {general exam:16600} Abdomen: {abdominal exam:16834} Pelvic: {pelvic exam:16852::cervix normal in appearance,external genitalia normal,no adnexal masses or tenderness,no cervical motion tenderness,rectovaginal septum normal,uterus normal size, shape, and consistency,vagina normal without discharge} Extremities: {extremity exam:5109} Neurologic: {neuro exam:17854}   Assessment:   No diagnosis found.   Plan:   There are no diagnoses linked to this encounter.

## 2024-06-07 ENCOUNTER — Encounter: Admitting: Certified Nurse Midwife

## 2024-06-12 ENCOUNTER — Ambulatory Visit: Admitting: Family Medicine

## 2024-06-14 ENCOUNTER — Telehealth: Payer: Self-pay

## 2024-06-14 NOTE — Patient Instructions (Signed)
 Nichole KATHEE Birmingham - I am sorry I was unable to reach you today for our scheduled appointment. I work with Sharma Coyer, MD and am calling to support your healthcare needs. Please contact me at [phone] at your earliest convenience. I look forward to speaking with you soon.   Thank you,   Jackson [facility] RN Care Manager Direct Dial: [phone]  Fax: [phone] Website: [email or web address]

## 2024-06-18 ENCOUNTER — Ambulatory Visit: Payer: Self-pay

## 2024-06-18 DIAGNOSIS — Z7409 Other reduced mobility: Secondary | ICD-10-CM

## 2024-06-18 DIAGNOSIS — Z741 Need for assistance with personal care: Secondary | ICD-10-CM

## 2024-06-18 DIAGNOSIS — Z9181 History of falling: Secondary | ICD-10-CM

## 2024-06-18 NOTE — Telephone Encounter (Signed)
 FYI Only or Action Required?: Action required by provider: In home PT, mobility devices.  Patient was last seen in primary care on 07/25/2023 by Sharma Coyer, MD.  Called Nurse Triage reporting Extremity Weakness - gets worn out walking to restroom. Cannot get up without help.  Symptoms began Ongoing.  Interventions attempted: Went to ED and has called EMS for help.  Symptoms are: gradually worsening.  Triage Disposition: See HCP Within 4 Hours (Or PCP Triage)  Patient/caregiver understands and will follow disposition?: no. Pt states that she will need a lot of time to get to an appointment 4-5 weeks. She is unable to have a VV with provider. She wants provider to work with her insurance to get her in house help. Pt was too worn out after using the toilet to come in for recent missed appt.                   Copied from CRM [id number]. Topic: Clinical - Red Word Triage >> Jun 18, 2024 12:29 PM Elle L wrote: Red Word that prompted transfer to Nurse Triage: The patient states that she is having weakness in her legs and is having difficulty walking. Reason for Disposition  [1] MODERATE weakness (e.g., interferes with work, school, normal activities) AND [2] cause unknown  (Exceptions: Weakness from acute minor illness or poor fluid intake; weakness is chronic and not worse.)  Answer Assessment - Initial Assessment Questions 1. DESCRIPTION: Describe how you are feeling.     Weakness - has trouble walking, weakness 2. SEVERITY: How bad is it?  Can you stand and walk?     Not always. Can't get up from the floor. Is worn out walking to the bathroom needs help getting off of the toilet 3. ONSET: When did these symptoms begin? (e.g., hours, days, weeks, months)     Ongoing 4. CAUSE: What do you think is causing the weakness or fatigue? (e.g., not drinking enough fluids, medical problem, trouble sleeping)     Unsure  6. OTHER SYMPTOMS: Do you have any other  symptoms? (e.g., chest pain, fever, cough, SOB, vomiting, diarrhea, bleeding, other areas of pain)     No pain - just weakness  Protocols used: Weakness (Generalized) and Fatigue-A-AH

## 2024-06-18 NOTE — Telephone Encounter (Signed)
 Please schedule patient for face to face visit to discuss mobility and place referrals for in home health assistance

## 2024-06-18 NOTE — Telephone Encounter (Signed)
 NA/mailbox is full. Ok for E2C2 to give patient provider's message and to schedule patient

## 2024-06-19 NOTE — Telephone Encounter (Signed)
 Copied from CRM [id number]. Topic: Appointments - Appointment Scheduling >> Jun 18, 2024  4:55 PM Turkey B wrote: Patient returned call says unable to make appt right now, because can't move around well

## 2024-06-21 NOTE — Addendum Note (Signed)
 Addended by: SIMMONS-ROBINSON, Markasia Carrol L on: 06/21/2024 01:01 PM   Modules accepted: Orders

## 2024-06-21 NOTE — Telephone Encounter (Signed)
 FYI for Simmons-Robinson Nurse   I have reviewed patient's message. I have reached out to Suzen Perry with [facility] to request assistance with getting patient set up for home care.   Will also request assistance with VBCI, referral placed today

## 2024-06-26 ENCOUNTER — Ambulatory Visit: Payer: Medicare Other

## 2024-06-26 DIAGNOSIS — Z0000 Encounter for general adult medical examination without abnormal findings: Secondary | ICD-10-CM | POA: Diagnosis not present

## 2024-06-26 NOTE — Patient Instructions (Addendum)
 Ms. Sydney Vaughan , Thank you for taking time out of your busy schedule to complete your Annual Wellness Visit with me. I enjoyed our conversation and look forward to speaking with you again next year. I, as well as your care team,  appreciate your ongoing commitment to your health goals. Please review the following plan we discussed and let me know if I can assist you in the future.    Follow up Visits: 07/02/25 @ 11:30 AM BY PHONE We will see or speak with you next year for your Next Medicare AWV with our clinical staff Have you seen your provider in the last 6 months (3 months if uncontrolled diabetes)? Yes  Clinician Recommendations:  Aim for 30 minutes of exercise or brisk walking, 6-8 glasses of water, and 5 servings of fruits and vegetables each day. TAKE CARE!      This is a list of the screenings recommended for you:  Health Maintenance  Topic Date Due   COVID-19 Vaccine (1) Never done   DTaP/Tdap/Td vaccine (1 - Tdap) Never done   Zoster (Shingles) Vaccine (1 of 2) Never done   DEXA scan (bone density measurement)  Never done   Flu Shot  06/21/2024   Medicare Annual Wellness Visit  06/26/2025   Pneumococcal Vaccine for age over 41  Completed   Hepatitis B Vaccine  Aged Out   HPV Vaccine  Aged Out   Meningitis B Vaccine  Aged Out    Advanced directives: (ACP Link)Information on Advanced Care Planning can be found at [employer] Advance Health Care Directives Advance Health Care Directives. [email or web address]  Advance Care Planning is important because it:  [x]  Makes sure you receive the medical care that is consistent with your values, goals, and preferences  [x]  It provides guidance to your family and loved ones and reduces their decisional burden about whether or not they are making the right decisions based on your wishes.  Follow the link provided in your after visit summary or read over the paperwork we have mailed to you to help you started getting your Advance  Directives in place. If you need assistance in completing these, please reach out to us  so that we can help you!

## 2024-06-26 NOTE — Progress Notes (Signed)
 Subjective:   Sydney Vaughan is a 83 y.o. who presents for a Medicare Wellness preventive visit.  As a reminder, Annual Wellness Visits don't include a physical exam, and some assessments may be limited, especially if this visit is performed virtually. We may recommend an in-person follow-up visit with your provider if needed.  Visit Complete: Virtual I connected with  Nichole KATHEE Birmingham on 06/26/24 by a audio enabled telemedicine application and verified that I am speaking with the correct person using two identifiers.  Patient Location: Home  Provider Location: Home Office  I discussed the limitations of evaluation and management by telemedicine. The patient expressed understanding and agreed to proceed.  Vital Signs: Because this visit was a virtual/telehealth visit, some criteria may be missing or patient reported. Any vitals not documented were not able to be obtained and vitals that have been documented are patient reported.  VideoDeclined- This patient declined Librarian, academic. Therefore the visit was completed with audio only.  Persons Participating in Visit: Patient.  AWV Questionnaire: No: Patient Medicare AWV questionnaire was not completed prior to this visit.  Cardiac Risk Factors include: advanced age (>35men, >34 women);dyslipidemia;sedentary lifestyle;obesity (BMI >30kg/m2)     Objective:    There were no vitals filed for this visit. There is no height or weight on file to calculate BMI.     06/26/2024    2:44 PM 04/11/2024    4:00 PM 03/30/2024    4:12 PM 06/21/2023   12:10 PM 03/22/2022    2:00 AM  Advanced Directives  Does Patient Have a Medical Advance Directive? No No No Yes No  Type of Advance Directive    Healthcare Power of Attorney   Would patient like information on creating a medical advance directive? No - Patient declined No - Patient declined No - Patient declined  No - Patient declined    Current Medications  (verified) Outpatient Encounter Medications as of 06/26/2024  Medication Sig   atenolol  (TENORMIN ) 50 MG tablet Take 0.5 tablet (25 mg) in the morning, and 1 tablet (50 mg) in the evening.   clonazePAM  (KLONOPIN ) 0.5 MG tablet Take 1 tablet (0.5 mg total) by mouth daily as needed. for anxiety   ELIQUIS  5 MG TABS tablet TAKE 1 TABLET BY MOUTH TWICE  DAILY   sertraline  (ZOLOFT ) 25 MG tablet TAKE 2 TABLETS BY MOUTH DAILY   nitrofurantoin , macrocrystal-monohydrate, (MACROBID ) 100 MG capsule Take 1 capsule (100 mg total) by mouth 2 (two) times daily. (Patient not taking: Reported on 06/26/2024)   No facility-administered encounter medications on file as of 06/26/2024.    Allergies (verified) Patient has no known allergies.   History: Past Medical History:  Diagnosis Date   AMS (altered mental status) 03/22/2022   Anxiety    Atrial fibrillation with rapid ventricular response (HCC) 03/2022   New diagnosis setting of UTI and TIA.-Next seen on monitor.   Depression    History of cardioembolic stroke    History of tobacco abuse    Hyperlipidemia with target LDL less than 70    Unfortunately, not willing to take statin.  Did not start statin that was ordered in the hospital.  She is very fearful of side effects.   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   TIA due to embolism (HCC) 03/2022   Past Surgical History:  Procedure Laterality Date   BREAST SURGERY  TOTAL MASTECTOMY     Left in 1986, Right in 1994   Family History  Problem Relation Age of Onset   Heart attack Mother    Cancer Father        colon   Stroke Father    Cancer Sister        ovarian   Cancer Daughter        ovarian   Cancer Daughter        Breast   Cancer Maternal Grandmother        breast   Social History   Socioeconomic History   Marital status: Widowed    Spouse name: Not on file   Number of children: Not on file    Years of education: Not on file   Highest education level: Not on file  Occupational History   Not on file  Tobacco Use   Smoking status: Former    Types: Cigarettes   Smokeless tobacco: Never  Substance and Sexual Activity   Alcohol use: Not Currently   Drug use: Never   Sexual activity: Not Currently  Other Topics Concern   Not on file  Social History Narrative   Very upset.  Daughter is dying of cancer.  She feels very guilty.   Cries a lot.   Also, recently moved out of her house to downsize after living there for 40 years.  Been quite emotional event.  Lots of social stress.   Social Drivers of Corporate investment banker Strain: Low Risk  (06/26/2024)   Overall Financial Resource Strain (CARDIA)    Difficulty of Paying Living Expenses: Not hard at all  Food Insecurity: No Food Insecurity (06/26/2024)   Hunger Vital Sign    Worried About Running Out of Food in the Last Year: Never true    Ran Out of Food in the Last Year: Never true  Transportation Needs: No Transportation Needs (06/26/2024)   PRAPARE - Administrator, Civil Service (Medical): No    Lack of Transportation (Non-Medical): No  Physical Activity: Inactive (06/26/2024)   Exercise Vital Sign    Days of Exercise per Week: 0 days    Minutes of Exercise per Session: 0 min  Stress: No Stress Concern Present (06/26/2024)   [employer] of Occupational Health - Occupational Stress Questionnaire    Feeling of Stress: Not at all  Social Connections: Socially Isolated (06/26/2024)   Social Connection and Isolation Panel    Frequency of Communication with Friends and Family: More than three times a week    Frequency of Social Gatherings with Friends and Family: Never    Attends Religious Services: Never    Database administrator or Organizations: No    Attends Banker Meetings: Never    Marital Status: Widowed    Tobacco Counseling Counseling given: Not Answered    Clinical  Intake:  Pre-visit preparation completed: Yes  Pain : No/denies pain     BMI - recorded: 37.6 Nutritional Status: BMI > 30  Obese Nutritional Risks: None Diabetes: No  Lab Results  Component Value Date   HGBA1C 4.5 (L) 03/21/2022     How often do you need to have someone help you when you read instructions, pamphlets, or other written materials from your doctor or pharmacy?: 1 - Never  Interpreter Needed?: No  Information entered by :: JHONNIE DAS, LPN   Activities of Daily Living     06/26/2024    2:46 PM  In your  present state of health, do you have any difficulty performing the following activities:  Hearing? 1  Vision? 0  Difficulty concentrating or making decisions? 0  Walking or climbing stairs? 1  Dressing or bathing? 0  Doing errands, shopping? 1  Preparing Food and eating ? N  Using the Toilet? N  In the past six months, have you accidently leaked urine? N  Do you have problems with loss of bowel control? N  Managing your Medications? N  Managing your Finances? N  Housekeeping or managing your Housekeeping? Y    Patient Care Team: Sharma Coyer, MD as PCP - General (Family Medicine) Anner Alm ORN, MD as PCP - Cardiology (Cardiology) Kennyth Chew, MD as PCP - Electrophysiology (Cardiology)  I have updated your Care Teams any recent Medical Services you may have received from other providers in the past year.     Assessment:   This is a routine wellness examination for Norene.  Hearing/Vision screen Hearing Screening - Comments:: NO AIDS Vision Screening - Comments:: [facility]- NO APPT IN A LONG TIME    Goals Addressed             This Visit's Progress    DIET - EAT MORE FRUITS AND VEGETABLES         Depression Screen     06/26/2024    2:41 PM 04/11/2024    4:30 PM 04/11/2024    4:15 PM 06/21/2023   12:03 PM 03/22/2023    3:55 PM 02/22/2023    1:33 PM 01/25/2023    2:31 PM  PHQ 2/9 Scores  PHQ - 2 Score 1 1 1 1 3  0 1   PHQ- 9 Score 2    5 3 5     Fall Risk     06/26/2024    2:46 PM 04/11/2024    4:30 PM 06/21/2023   11:59 AM 02/22/2023    1:33 PM 01/25/2023    2:31 PM  Fall Risk   Falls in the past year? 0 1 0 0 0  Number falls in past yr: 0 1 0 0 0  Injury with Fall? 0 0 0 0 0  Risk for fall due to : No Fall Risks History of fall(s);Impaired balance/gait;Medication side effect No Fall Risks No Fall Risks No Fall Risks  Follow up Falls evaluation completed;Falls prevention discussed Falls prevention discussed Falls prevention discussed;Education provided      MEDICARE RISK AT HOME:  Medicare Risk at Home Any stairs in or around the home?: No If so, are there any without handrails?: No Home free of loose throw rugs in walkways, pet beds, electrical cords, etc?: Yes Adequate lighting in your home to reduce risk of falls?: Yes Life alert?: No Use of a cane, walker or w/c?: Yes (WALKER ALL THE TIME) Grab bars in the bathroom?: No Shower chair or bench in shower?: No Elevated toilet seat or a handicapped toilet?: Yes  TIMED UP AND GO:  Was the test performed?  No  Cognitive Function: 6CIT completed        06/26/2024    2:50 PM 06/21/2023   12:13 PM  6CIT Screen  What Year? 0 points 0 points  What month? 0 points 0 points  What time? 0 points 0 points  Count back from 20 0 points 0 points  Months in reverse 0 points 0 points  Repeat phrase 0 points 0 points  Total Score 0 points 0 points    Immunizations Immunization History  Administered Date(s) Administered  Fluad Trivalent(High Dose 65+) 07/25/2023   Influenza Inj Mdck Quad Pf 08/05/2021   Influenza, Seasonal, Injecte, Preservative Fre 08/29/2012   Influenza,inj,Quad PF,6+ Mos 08/20/2013, 12/05/2016, 11/03/2017, 12/03/2018, 11/28/2022   Influenza-Unspecified 02/02/2015, 10/20/2015   Pneumococcal Conjugate-13 10/20/2015   Pneumococcal Polysaccharide-23 08/20/2013    Screening Tests Health Maintenance  Topic Date Due   COVID-19  Vaccine (1) Never done   DTaP/Tdap/Td (1 - Tdap) Never done   Zoster Vaccines- Shingrix (1 of 2) Never done   DEXA SCAN  Never done   INFLUENZA VACCINE  06/21/2024   Medicare Annual Wellness (AWV)  06/26/2025   Pneumococcal Vaccine: 50+ Years  Completed   Hepatitis B Vaccines  Aged Out   HPV VACCINES  Aged Out   Meningococcal B Vaccine  Aged Out    Health Maintenance  Health Maintenance Due  Topic Date Due   COVID-19 Vaccine (1) Never done   DTaP/Tdap/Td (1 - Tdap) Never done   Zoster Vaccines- Shingrix (1 of 2) Never done   DEXA SCAN  Never done   INFLUENZA VACCINE  06/21/2024   Health Maintenance Items Addressed: NEEDS SHINGRIX & TDAP; BDS DECLINED; AGED OUT OF MAMMOGRAM & COLONOSCOPY  Additional Screening:  Vision Screening: Recommended annual ophthalmology exams for early detection of glaucoma and other disorders of the eye. Would you like a referral to an eye doctor? No    Dental Screening: Recommended annual dental exams for proper oral hygiene  Community Resource Referral / Chronic Care Management: CRR required this visit?  No   CCM required this visit?  No   Plan:    I have personally reviewed and noted the following in the patient's chart:   Medical and social history Use of alcohol, tobacco or illicit drugs  Current medications and supplements including opioid prescriptions. Patient is not currently taking opioid prescriptions. Functional ability and status Nutritional status Physical activity Advanced directives List of other physicians Hospitalizations, surgeries, and ER visits in previous 12 months Vitals Screenings to include cognitive, depression, and falls Referrals and appointments  In addition, I have reviewed and discussed with patient certain preventive protocols, quality metrics, and best practice recommendations. A written personalized care plan for preventive services as well as general preventive health recommendations were provided to  patient.   Jhonnie GORMAN Das, LPN   11/23/7972   After Visit Summary: (MyChart) Due to this being a telephonic visit, the after visit summary with patients personalized plan was offered to patient via MyChart   Notes: Nothing significant to report at this time.

## 2024-07-02 ENCOUNTER — Telehealth: Payer: Self-pay

## 2024-07-04 ENCOUNTER — Other Ambulatory Visit: Payer: Self-pay | Admitting: Licensed Clinical Social Worker

## 2024-07-05 NOTE — Patient Outreach (Signed)
 Complex Care Management   Visit Note  07/05/2024  Name:  Sydney Vaughan MRN: [id number] DOB: 13-Feb-1941  Situation: Referral received for Complex Care Management related to Mental/Behavioral Health diagnosis Anxiety I obtained verbal consent from Patient.  Visit completed with patient  on the phone  Background:   Past Medical History:  Diagnosis Date   AMS (altered mental status) 03/22/2022   Anxiety    Atrial fibrillation with rapid ventricular response (HCC) 03/2022   New diagnosis setting of UTI and TIA.-Next seen on monitor.   Depression    History of cardioembolic stroke    History of tobacco abuse    Hyperlipidemia with target LDL less than 70    Unfortunately, not willing to take statin.  Did not start statin that was ordered in the hospital.  She is very fearful of side effects.   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   TIA due to embolism (HCC) 03/2022    Assessment: Patient Reported Symptoms:  Cognitive Cognitive Status: Alert and oriented to person, place, and time, Normal speech and language skills, Insightful and able to interpret abstract concepts Cognitive/Intellectual Conditions Management [RPT]: None reported or documented in medical history or problem list   Health Maintenance Behaviors: Annual physical exam, Exercise, Stress management Healing Pattern: Unsure Health Facilitated by: Stress management, Rest  Neurological Neurological Review of Symptoms: Other: [place] Neurological Symptoms/Conditions [RPT]: Experienced several episodes of generalized weakness. Patient declined offer for therapy, or further evaluation Neurological Management Strategies: Adequate rest, Coping strategies, Medical device, Routine screening Neurological Self-Management Outcome: 3 (uncertain)  HEENT HEENT Symptoms Reported: No symptoms reported HEENT Management Strategies: Routine screening     Cardiovascular Cardiovascular Symptoms Reported: No symptoms reported Cardiovascular Management Strategies: Adequate rest, Coping strategies, Medication therapy, Routine screening Cardiovascular Self-Management Outcome: 4 (good)  Respiratory Respiratory Symptoms Reported: No symptoms reported    Endocrine Endocrine Symptoms Reported: No symptoms reported Is patient diabetic?: No    Gastrointestinal Gastrointestinal Symptoms Reported: No symptoms reported Gastrointestinal Management Strategies: Diet modification, Medication therapy, Coping strategies Gastrointestinal Self-Management Outcome: 4 (good) Nutrition Risk Screen (CP): No indicators present  Genitourinary Genitourinary Symptoms Reported: Incontinence Genitourinary Management Strategies: Coping strategies, Incontinence garment/pad Genitourinary Self-Management Outcome: 4 (good)  Integumentary Integumentary Symptoms Reported: No symptoms reported    Musculoskeletal Musculoskelatal Symptoms Reviewed: Weakness Musculoskeletal Management Strategies: Routine screening, Medical device, Coping strategies, Adequate rest Musculoskeletal Self-Management Outcome: 3 (uncertain) Falls in the past year?: No Number of falls in past year: 1 or less Was there an injury with Fall?: No Fall Risk Category Calculator: 0 Patient Fall Risk Level: Low Fall Risk Patient at Risk for Falls Due to: No Fall Risks Fall risk Follow up: Falls evaluation completed, Falls prevention discussed  Psychosocial Psychosocial Symptoms Reported: Sadness - if selected complete PHQ 2-9 Behavioral Management Strategies: Coping strategies, Medication therapy, Support system Behavioral Health Self-Management Outcome: 4 (good) Major Change/Loss/Stressor/Fears (CP): Medical condition, self, Death of a loved one Techniques to Cope with Loss/Stress/Change: Diversional activities, Medication Quality of Family Relationships: supportive Do you feel physically threatened by  others?: No      07/04/2024    3:03 PM  Depression screen PHQ 2/9  Decreased Interest 0  Down, Depressed, Hopeless 1  PHQ - 2 Score 1  Altered sleeping 1  Tired, decreased energy 0  Change in appetite 0  Feeling bad or failure about yourself  1  Trouble concentrating 0  Moving slowly or fidgety/restless 0  Suicidal thoughts 0  PHQ-9 Score 3  Difficult doing work/chores Somewhat difficult    There were no vitals filed for this visit.  Medications Reviewed Today     Reviewed by Kit Alm LABOR, LCSW (Social Worker) on 07/04/24 at 1457  Med List Status: <None>   Medication Order Taking? Sig Documenting Provider Last Dose Status Informant  atenolol  (TENORMIN ) 50 MG tablet [id number] Yes Take 0.5 tablet (25 mg) in the morning, and 1 tablet (50 mg) in the evening. Anner Alm ORN, MD  Active            Med Note [facility]   Thu Apr 11, 2024  3:58 PM) Reports dose changed to half in the morning and half in the evening  clonazePAM  (KLONOPIN ) 0.5 MG tablet [id number] Yes Take 1 tablet (0.5 mg total) by mouth daily as needed. for anxiety Simmons-Robinson, Rockie, MD  Active   ELIQUIS  5 MG TABS tablet [id number] Yes TAKE 1 TABLET BY MOUTH TWICE  DAILY Anner Alm ORN, MD  Active   nitrofurantoin , macrocrystal-monohydrate, (MACROBID ) 100 MG capsule [id number]  Take 1 capsule (100 mg total) by mouth 2 (two) times daily.  Patient not taking: Reported on 06/26/2024   Penne Knee, MD  Active   sertraline  (ZOLOFT ) 25 MG tablet [id number] Yes TAKE 2 TABLETS BY MOUTH DAILY Simmons-Robinson, Rockie, MD  Active             Recommendation:   Continue Current Plan of Care  Follow Up Plan:   Telephone follow up appointment date/time:  08/01/2024  Alm Kit, LCSW [place], [facility] Licensed Clinical Social Worker Care Coordinator [phone]

## 2024-07-05 NOTE — Patient Instructions (Signed)
 Visit Information  Thank you for taking time to visit with me today. Please don't hesitate to contact me if I can be of assistance to you before our next scheduled appointment.  Our next appointment is by telephone on 08/01/2024  Please call the care guide team at [phone] if you need to cancel or reschedule your appointment.   Following is a copy of your care plan:   Goals Addressed             This Visit's Progress    VBCI Social Work Care Plan       Problems:   Level of Care Concerns:Inability to perform ADL's independently  CSW Clinical Goal(s):   Over the next 6 weeks the Patient will work with Child psychotherapist to address concerns related to ability to care for self in home.  Interventions:  Level of Care Concerns in a patient with Depressive Disorder Current level of care: Home with other family or significant other(s): caregiver  Evaluation of patient's unmet needs in current living environment ADL's Assessed needs, level of care concerns, how currently meeting needs and barriers to care Will request referral for PT/OT services from provider Assessed for MH needs  Patient Goals/Self-Care Activities:  Continue taking your medication as prescribed.   Look for follow up regarding PT/OT from provider  Plan:   Telephone follow up appointment with care management team member scheduled for:  08/01/2024        Please call the Suicide and Crisis Lifeline: 988 if you are experiencing a Mental Health or Behavioral Health Crisis or need someone to talk to.  Patient verbalizes understanding of instructions and care plan provided today and agrees to view in MyChart. Active MyChart status and patient understanding of how to access instructions and care plan via MyChart confirmed with patient.     Alm Armor, LCSW [place], [facility] Licensed Clinical Social Worker Care Coordinator [phone]

## 2024-07-17 ENCOUNTER — Other Ambulatory Visit: Payer: Self-pay

## 2024-07-17 ENCOUNTER — Emergency Department

## 2024-07-17 ENCOUNTER — Observation Stay
Admission: EM | Admit: 2024-07-17 | Discharge: 2024-07-19 | Disposition: A | Discharge status: Home / Self Care | Attending: Hospitalist | Admitting: Hospitalist

## 2024-07-17 DIAGNOSIS — N9089 Other specified noninflammatory disorders of vulva and perineum: Secondary | ICD-10-CM | POA: Insufficient documentation

## 2024-07-17 DIAGNOSIS — R262 Difficulty in walking, not elsewhere classified: Secondary | ICD-10-CM | POA: Insufficient documentation

## 2024-07-17 DIAGNOSIS — M6281 Muscle weakness (generalized): Secondary | ICD-10-CM | POA: Insufficient documentation

## 2024-07-17 DIAGNOSIS — G459 Transient cerebral ischemic attack, unspecified: Secondary | ICD-10-CM | POA: Diagnosis not present

## 2024-07-17 DIAGNOSIS — A419 Sepsis, unspecified organism: Principal | ICD-10-CM | POA: Insufficient documentation

## 2024-07-17 DIAGNOSIS — N3091 Cystitis, unspecified with hematuria: Secondary | ICD-10-CM | POA: Diagnosis not present

## 2024-07-17 DIAGNOSIS — N939 Abnormal uterine and vaginal bleeding, unspecified: Secondary | ICD-10-CM | POA: Diagnosis not present

## 2024-07-17 DIAGNOSIS — E785 Hyperlipidemia, unspecified: Secondary | ICD-10-CM | POA: Diagnosis not present

## 2024-07-17 DIAGNOSIS — N390 Urinary tract infection, site not specified: Secondary | ICD-10-CM | POA: Diagnosis present

## 2024-07-17 DIAGNOSIS — A4150 Gram-negative sepsis, unspecified: Secondary | ICD-10-CM | POA: Diagnosis present

## 2024-07-17 DIAGNOSIS — R531 Weakness: Principal | ICD-10-CM

## 2024-07-17 DIAGNOSIS — I480 Paroxysmal atrial fibrillation: Secondary | ICD-10-CM | POA: Insufficient documentation

## 2024-07-17 DIAGNOSIS — R000 Tachycardia, unspecified: Secondary | ICD-10-CM | POA: Diagnosis not present

## 2024-07-17 LAB — COMPREHENSIVE METABOLIC PANEL WITH GFR
ALT: 8 U/L (ref 0–44)
AST: 13 U/L — ABNORMAL LOW (ref 15–41)
Albumin: 3.1 g/dL — ABNORMAL LOW (ref 3.5–5.0)
Alkaline Phosphatase: 82 U/L (ref 38–126)
Anion gap: 10 (ref 5–15)
BUN: 16 mg/dL (ref 8–23)
CO2: 26 mmol/L (ref 22–32)
Calcium: 8.6 mg/dL — ABNORMAL LOW (ref 8.9–10.3)
Chloride: 105 mmol/L (ref 98–111)
Creatinine, Ser: 1.02 mg/dL — ABNORMAL HIGH (ref 0.44–1.00)
GFR, Estimated: 55 mL/min — ABNORMAL LOW (ref >60)
Glucose, Bld: 133 mg/dL — ABNORMAL HIGH (ref 70–99)
Potassium: 3.5 mmol/L (ref 3.5–5.1)
Sodium: 141 mmol/L (ref 135–145)
Total Bilirubin: 0.7 mg/dL (ref 0.0–1.2)
Total Protein: 6.9 g/dL (ref 6.5–8.1)

## 2024-07-17 LAB — URINALYSIS, W/ REFLEX TO CULTURE (INFECTION SUSPECTED)
Bilirubin Urine: NEGATIVE
Glucose, UA: NEGATIVE mg/dL
Ketones, ur: NEGATIVE mg/dL
Nitrite: NEGATIVE
Protein, ur: NEGATIVE mg/dL
Specific Gravity, Urine: 1.018 (ref 1.005–1.030)
WBC, UA: >50 WBC/hpf (ref 0–5)
pH: 7 (ref 5.0–8.0)

## 2024-07-17 LAB — MAGNESIUM: Magnesium: 1.5 mg/dL — ABNORMAL LOW (ref 1.7–2.4)

## 2024-07-17 LAB — CBC WITH DIFFERENTIAL/PLATELET
Abs Immature Granulocytes: 0.06 K/uL (ref 0.00–0.07)
Basophils Absolute: 0 K/uL (ref 0.0–0.1)
Basophils Relative: 0 %
Eosinophils Absolute: 0.1 K/uL (ref 0.0–0.5)
Eosinophils Relative: 1 %
HCT: 39.9 % (ref 36.0–46.0)
Hemoglobin: 13.1 g/dL (ref 12.0–15.0)
Immature Granulocytes: 1 %
Lymphocytes Relative: 15 %
Lymphs Abs: 1.4 K/uL (ref 0.7–4.0)
MCH: 31.5 pg (ref 26.0–34.0)
MCHC: 32.8 g/dL (ref 30.0–36.0)
MCV: 95.9 fL (ref 80.0–100.0)
Monocytes Absolute: 1.2 K/uL — ABNORMAL HIGH (ref 0.1–1.0)
Monocytes Relative: 14 %
Neutro Abs: 6.1 K/uL (ref 1.7–7.7)
Neutrophils Relative %: 69 %
Platelets: 203 K/uL (ref 150–400)
RBC: 4.16 MIL/uL (ref 3.87–5.11)
RDW: 12.9 % (ref 11.5–15.5)
WBC: 8.9 K/uL (ref 4.0–10.5)
nRBC: 0 % (ref 0.0–0.2)

## 2024-07-17 LAB — WET PREP, GENITAL
Clue Cells Wet Prep HPF POC: NONE SEEN
Sperm: NONE SEEN
Trich, Wet Prep: NONE SEEN
WBC, Wet Prep HPF POC: <10 (ref <10)
Yeast Wet Prep HPF POC: NONE SEEN

## 2024-07-17 LAB — TROPONIN I (HIGH SENSITIVITY): Troponin I (High Sensitivity): 5 ng/L (ref <18)

## 2024-07-17 LAB — TYPE AND SCREEN
ABO/RH(D): O NEG
Antibody Screen: NEGATIVE

## 2024-07-17 LAB — T4, FREE: Free T4: 0.73 ng/dL (ref 0.61–1.12)

## 2024-07-17 LAB — PROTIME-INR
INR: 1.4 — ABNORMAL HIGH (ref 0.8–1.2)
Prothrombin Time: 17.8 s — ABNORMAL HIGH (ref 11.4–15.2)

## 2024-07-17 LAB — TSH: TSH: 6.004 u[IU]/mL — ABNORMAL HIGH (ref 0.350–4.500)

## 2024-07-17 MED ORDER — CLONAZEPAM 0.5 MG PO TABS
0.5000 mg | ORAL_TABLET | Freq: Two times a day (BID) | ORAL | Status: DC | PRN
  Administered 2024-07-17: 0.5 mg via ORAL
  Filled 2024-07-17: qty 1

## 2024-07-17 MED ORDER — ONDANSETRON HCL 4 MG/2ML IJ SOLN: 4.0000 mg | Freq: Four times a day (QID) | INTRAMUSCULAR | Status: DC | PRN

## 2024-07-17 MED ORDER — ACETAMINOPHEN 650 MG RE SUPP: 650.0000 mg | Freq: Four times a day (QID) | RECTAL | Status: DC | PRN

## 2024-07-17 MED ORDER — ONDANSETRON HCL 4 MG PO TABS: 4.0000 mg | ORAL_TABLET | Freq: Four times a day (QID) | ORAL | Status: DC | PRN

## 2024-07-17 MED ORDER — ACETAMINOPHEN 325 MG PO TABS: 650.0000 mg | ORAL_TABLET | Freq: Four times a day (QID) | ORAL | Status: DC | PRN

## 2024-07-17 MED ORDER — ATENOLOL 25 MG PO TABS
25.0000 mg | ORAL_TABLET | Freq: Every day | ORAL | Status: DC
  Administered 2024-07-17 – 2024-07-19 (×3): 25 mg via ORAL
  Filled 2024-07-17 (×3): qty 1

## 2024-07-17 MED ORDER — MAGNESIUM SULFATE 2 GM/50ML IV SOLN
2.0000 g | Freq: Once | INTRAVENOUS | Status: AC
  Administered 2024-07-17: 2 g via INTRAVENOUS
  Filled 2024-07-17: qty 50

## 2024-07-17 MED ORDER — SODIUM CHLORIDE 0.9 % IV BOLUS (SEPSIS)
500.0000 mL | Freq: Once | INTRAVENOUS | Status: AC
  Administered 2024-07-17: 500 mL via INTRAVENOUS

## 2024-07-17 MED ORDER — SERTRALINE HCL 50 MG PO TABS
50.0000 mg | ORAL_TABLET | Freq: Every day | ORAL | Status: DC
Start: 2024-07-17 — End: 2024-07-19
  Administered 2024-07-17 – 2024-07-19 (×3): 50 mg via ORAL
  Filled 2024-07-17 (×3): qty 1

## 2024-07-17 MED ORDER — MORPHINE SULFATE (PF) 2 MG/ML IV SOLN: 2.0000 mg | INTRAVENOUS | Status: DC | PRN

## 2024-07-17 MED ORDER — APIXABAN 5 MG PO TABS
5.0000 mg | ORAL_TABLET | Freq: Two times a day (BID) | ORAL | Status: DC
  Administered 2024-07-17 – 2024-07-19 (×5): 5 mg via ORAL
  Filled 2024-07-17 (×5): qty 1

## 2024-07-17 MED ORDER — POLYETHYLENE GLYCOL 3350 17 G PO PACK
17.0000 g | PACK | Freq: Every day | ORAL | Status: DC | PRN
Start: 2024-07-17 — End: 2024-07-19

## 2024-07-17 MED ORDER — SODIUM CHLORIDE 0.9 % IV SOLN
2.0000 g | INTRAVENOUS | Status: DC
  Administered 2024-07-18 – 2024-07-19 (×2): 2 g via INTRAVENOUS
  Filled 2024-07-17 (×2): qty 20

## 2024-07-17 MED ORDER — SODIUM CHLORIDE 0.9 % IV SOLN
2.0000 g | Freq: Once | INTRAVENOUS | Status: AC
  Administered 2024-07-17: 2 g via INTRAVENOUS
  Filled 2024-07-17: qty 20

## 2024-07-17 MED ORDER — ENOXAPARIN SODIUM 60 MG/0.6ML IJ SOSY: 0.5000 mg/kg | PREFILLED_SYRINGE | INTRAMUSCULAR | Status: DC

## 2024-07-17 NOTE — TOC Initial Note (Signed)
 Transition of Care [facility]) - Initial/Assessment Note    Patient Details  Name: Sydney Vaughan MRN: [id number] Date of Birth: October 05, 1941  [facility] CM/SW Contact:    Corean ONEIDA Haddock, RN Phone Number: 07/17/2024, 4:51 PM  Clinical Narrative:                 Admitted for: UTI Admitted from: home with son Dwayne  ERE:[id number]  Current home health/prior home health/DME: RW and electric scooter  Therapy recommending HH and WC Patient in agreement.  Patient states that she does not have preference of agency.  Referral made to Shaun with [facility] for PT and OT.  And Referral made to Mitch with [employer] for light weight manuel WC        Patient Goals and CMS Choice            Expected Discharge Plan and Services                                              Prior Living Arrangements/Services                       Activities of Daily Living   ADL Screening (condition at time of admission) Independently performs ADLs?: No Does the patient have a NEW difficulty with bathing/dressing/toileting/self-feeding that is expected to last >3 days?: No Does the patient have a NEW difficulty with getting in/out of bed, walking, or climbing stairs that is expected to last >3 days?: No Does the patient have a NEW difficulty with communication that is expected to last >3 days?: No Is the patient deaf or have difficulty hearing?: No Does the patient have difficulty seeing, even when wearing glasses/contacts?: No Does the patient have difficulty concentrating, remembering, or making decisions?: No  Permission Sought/Granted                  Emotional Assessment              Admission diagnosis:  Hypomagnesemia [E83.42] Acute UTI [N39.0] Vulvar mass [N90.89] Generalized weakness [R53.1] Sepsis due to gram-negative UTI (HCC) [A41.50, N39.0] Patient Active Problem List   Diagnosis Date Noted   Sepsis due to gram-negative UTI  (HCC) 07/17/2024   Vaginal bleeding 07/17/2024   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   Hematuria due to cystitis 09/07/2023   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   Healthcare maintenance 07/25/2023   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   SOBOE (shortness of breath on exertion) 02/22/2023   Hypotension due to hypovolemia 02/22/2023   Ambulates with cane 01/27/2023   Skin picking habit 01/25/2023   Personal history of ovarian cancer 03/22/2022   TIA (transient ischemic attack) 03/22/2022   History of tobacco abuse 03/21/2022   Obesity 03/21/2022   Longstanding persistent atrial fibrillation (HCC) 03/2022   Hyperlipidemia with target LDL less than 70 12/03/2018   Anxiety state 10/03/2012   Depressive disorder 10/03/2012   PCP:  Sharma Coyer, MD Pharmacy:   [facility] - [place], [place] - [address] [place] [address] Phone: [phone] Fax: [phone]     Social Drivers of Health (SDOH) Social History: SDOH Screenings   Food Insecurity: No Food Insecurity (07/17/2024)  Housing: Low Risk  (07/17/2024)  Transportation Needs: No Transportation Needs (07/17/2024)  Utilities: Not At  Risk (07/17/2024)  Alcohol Screen: Low Risk  (06/26/2024)  Depression (PHQ2-9): Low Risk  (07/04/2024)  Financial Resource Strain: Low Risk  (06/26/2024)  Physical Activity: Inactive (06/26/2024)  Social Connections: Socially Isolated (07/17/2024)  Stress: No Stress Concern Present (06/26/2024)  Tobacco Use: Medium Risk (07/17/2024)  Health Literacy: Adequate Health Literacy (06/26/2024)   SDOH Interventions:     Readmission Risk Interventions     No data to display

## 2024-07-17 NOTE — ED Notes (Signed)
 Pure wick placed on pt by Nt and RN Magen , peri care done and clean brief placed.

## 2024-07-17 NOTE — H&P (Signed)
 History and Physical    Sydney Vaughan FMW:[id number] DOB: 13-Jul-1941 DOA: 07/17/2024  DOS: the patient was seen and examined on 07/17/2024  PCP: Sharma Coyer, MD   Patient coming from: Home  I have personally briefly reviewed patient's old medical records in [facility]  Chief Complaint: Generalized weakness, unable to walk for the last 4 months  HPI: Sydney Vaughan is a pleasant 83 y.o. female with medical history significant for atrial fibrillation on Eliquis , TIA, obesity, history of ovarian cancer s/p hysterectomy, BRCA positive s/p bilateral mastectomy, morbid obesity who presented to ED with progressively worsening generalized weakness, intermittent vaginal bleeding.  Patient lives at home with her son but stated that he is not able to help get her up when she is unable to stand.  Last night she went to the bathroom and she had some diarrhea and her leg got numb, could not stand up and she called 911.  She denies any falls, she had been having some vaginal bleeding but unable to follow-up with OB/GYN.  She stated that when she stops Eliquis  and in 3 to 4 days her bleeding gets better.  She denies any dysuria, numbness, focal weakness, tingling.  She denies any fever, chills, nausea, vomiting, chest pain, shortness of breath.  Overall her symptoms were nonspecific but she says she was not able to walk around at home and she has intermittent vaginal bleeding.  ED Course: Upon arrival to the ED, patient is found to have normal hemoglobin at 13, normal potassium, magnesium  of 1.5, she was in atrial fibrillation slightly tacky but later it was 70-80 heart rate.  Replaced magnesium  she has some elevated TSH but normal T4.  Negative troponin.  Positive urine for UTI, cultures pending received Rocephin .  Hospitalist service was consulted for evaluation for admission for UTI and hypomagnesemia requiring workup for vaginal bleeding.  Review of Systems:  ROS  All other systems negative  except as noted in the HPI.  Past Medical History:  Diagnosis Date   AMS (altered mental status) 03/22/2022   Anxiety    Atrial fibrillation with rapid ventricular response (HCC) 03/2022   New diagnosis setting of UTI and TIA.-Next seen on monitor.   Depression    History of cardioembolic stroke    History of tobacco abuse    Hyperlipidemia with target LDL less than 70    Unfortunately, not willing to take statin.  Did not start statin that was ordered in the hospital.  She is very fearful of side effects.   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 Her smoking use included cigarettes. She has never used smokeless tobacco. She reports that she does not currently use alcohol. She reports that she does not use drugs.  No Known Allergies  Family History  Problem Relation Age of Onset   Heart attack Mother    Cancer Father        colon   Stroke Father    Cancer Sister        ovarian   Cancer Daughter        ovarian   Cancer Daughter  Breast   Cancer Maternal Grandmother        breast    Prior to Admission medications   Medication Sig Start Date End Date Taking? Authorizing Provider  atenolol  (TENORMIN ) 50 MG tablet Take 0.5 tablet (25 mg) in the morning, and 1 tablet (50 mg) in the evening. 09/07/23  Yes Anner Alm ORN, MD  clonazePAM  (KLONOPIN ) 0.5 MG tablet Take 1 tablet (0.5 mg total) by mouth daily as needed. for anxiety 05/15/24  Yes Simmons-Robinson, Makiera, MD  ELIQUIS  5 MG TABS tablet TAKE 1 TABLET BY MOUTH TWICE  DAILY 09/18/23  Yes Anner Alm ORN, MD  sertraline  (ZOLOFT ) 25 MG tablet TAKE 2 TABLETS BY MOUTH DAILY 05/15/24  Yes Simmons-Robinson, Makiera, MD  nitrofurantoin ,  macrocrystal-monohydrate, (MACROBID ) 100 MG capsule Take 1 capsule (100 mg total) by mouth 2 (two) times daily. Patient not taking: Reported on 06/26/2024 10/30/23   Penne Knee, MD    Physical Exam: Vitals:   07/17/24 0244 07/17/24 0249 07/17/24 0710 07/17/24 0803  BP:  114/79 (!) 127/104 114/82  Pulse: 84  (!) 107 (!) 54  Resp: 20  18 20   Temp: 98.7 F (37.1 C)  98.5 F (36.9 C) 97.9 F (36.6 C)  TempSrc: Oral  Oral Oral  SpO2: 99%  100% 100%  Weight: 112.1 kg     Height: 5' 6 (1.676 m)       Physical Exam   Constitutional: Alert, awake, calm, comfortable HEENT: Neck supple Respiratory: Clear to auscultation B/L, no wheezing, no rales.  Cardiovascular: Regular rate and rhythm, no murmurs / rubs / gallops. No extremity edema. 2+ pedal pulses. No carotid bruits.  Abdomen: Soft, no tenderness, Bowel sounds positive.  Musculoskeletal: no clubbing / cyanosis. Good ROM, no contractures. Normal muscle tone.  Skin: no rashes, lesions, ulcers. Neurologic: CN 2-12 grossly intact. Sensation intact, No focal deficit identified Psychiatric: Alert and oriented x 3. Normal mood.    Labs on Admission: I have personally reviewed following labs and imaging studies  CBC: Recent Labs  Lab 07/17/24 0248  WBC 8.9  NEUTROABS 6.1  HGB 13.1  HCT 39.9  MCV 95.9  PLT 203   Basic Metabolic Panel: Recent Labs  Lab 07/17/24 0248  NA 141  K 3.5  CL 105  CO2 26  GLUCOSE 133*  BUN 16  CREATININE 1.02*  CALCIUM  8.6*  MG 1.5*   GFR: Estimated Creatinine Clearance: 54 mL/min (A) (by C-G formula based on SCr of 1.02 mg/dL (H)). Liver Function Tests: Recent Labs  Lab 07/17/24 0248  AST 13*  ALT 8  ALKPHOS 82  BILITOT 0.7  PROT 6.9  ALBUMIN 3.1*   No results for input(s): LIPASE, AMYLASE in the last 168 hours. No results for input(s): AMMONIA in the last 168 hours. Coagulation Profile: Recent Labs  Lab 07/17/24 0248  INR 1.4*   Cardiac Enzymes: Recent Labs  Lab  07/17/24 0248  TROPONINIHS 5   BNP (last 3 results) No results for input(s): BNP in the last 8760 hours. HbA1C: No results for input(s): HGBA1C in the last 72 hours. CBG: No results for input(s): GLUCAP in the last 168 hours. Lipid Profile: No results for input(s): CHOL, HDL, LDLCALC, TRIG, CHOLHDL, LDLDIRECT in the last 72 hours. Thyroid  Function Tests: Recent Labs    07/17/24 0248  TSH 6.004*  FREET4 0.73   Anemia Panel: No results for input(s): VITAMINB12, FOLATE, FERRITIN, TIBC, IRON, RETICCTPCT in the last 72 hours. Urine analysis:    Component Value Date/Time  COLORURINE YELLOW (A) 07/17/2024 0309   APPEARANCEUR HAZY (A) 07/17/2024 0309   APPEARANCEUR Hazy (A) 10/25/2023 1459   LABSPEC 1.018 07/17/2024 0309   PHURINE 7.0 07/17/2024 0309   GLUCOSEU NEGATIVE 07/17/2024 0309   HGBUR MODERATE (A) 07/17/2024 0309   BILIRUBINUR NEGATIVE 07/17/2024 0309   BILIRUBINUR Negative 10/25/2023 1459   KETONESUR NEGATIVE 07/17/2024 0309   PROTEINUR NEGATIVE 07/17/2024 0309   NITRITE NEGATIVE 07/17/2024 0309   LEUKOCYTESUR LARGE (A) 07/17/2024 0309    Radiological Exams on Admission: I have personally reviewed images CT HEAD WO CONTRAST ( ) Result Date: 07/17/2024 CLINICAL DATA:  Weakness EXAM: CT HEAD WITHOUT CONTRAST TECHNIQUE: Contiguous axial images were obtained from the base of the skull through the vertex without intravenous contrast. RADIATION DOSE REDUCTION: This exam was performed according to the departmental dose-optimization program which includes automated exposure control, adjustment of the mA and/or kV according to patient size and/or use of iterative reconstruction technique. 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 Chronic white matter ischemic changes are noted. No acute hemorrhage or acute infarct is seen. Vascular: No hyperdense vessel or unexpected calcification. Skull: Normal.  Negative for fracture or focal lesion. Sinuses/Orbits: No acute finding. Other: None. IMPRESSION: Chronic changes without acute abnormality. Electronically Signed   By: Oneil Devonshire M.D.   On: 07/17/2024 03:26    EKG: My personal interpretation of EKG shows: A-fib    Assessment/Plan Principal Problem:   Sepsis due to gram-negative UTI (HCC) Active Problems:   Hyperlipidemia with target LDL less than 70   TIA (transient ischemic attack)   Vaginal bleeding    Assessment and Plan: 83 year old morbidly obese female with multiple medical problems including but not limited to atrial fibrillation, HTN, HLD, TIA came in for generalized weakness, vaginal bleeding.  1.  Urinary tract infection/hemorrhagic cystitis - Patient used to have intermittent bleeding, mixed with urine - She stated that she has seen urologist and they advised for antibiotics for UTI otherwise no further intervention. - She was also told that there is some cyst.  And she was advised to see OB/GYN as outpatient but she had not seen them at. - She was started on ceftriaxone  in the emergency room.  Sent to cultures. - Will continue ceftriaxone  for urinary tract infection and will follow the cultures. - She has intermittent hematuria/vaginal bleeding, urology seen the patient now she needs to see OB/GYN.  I have requested a consult for OB/GYN today.  2.  Generalized weakness/deconditioning - She will be evaluated by PT/OT. - She has not been able to move around well at home for the last 4 months. - Her son at home is not able to take care of her. - She may need to go to rehab depending on the recommendation from PT/OT.  3.  Atrial fibrillation - Heart rate has been well-controlled. - Patient is on Eliquis . - She does not have anemia. - Will continue Eliquis  and follow the recommendation from GYN if there is any contraindication.  4.  HTN/HLD/TIA - Resume home medication  5.  Hypomagnesemia - Replaced - Will check  level in the morning     DVT prophylaxis: Eliquis  Code Status: Full Code Family Communication: None Disposition Plan: Home versus rehab Consults called: OB/GYN Admission status: Inpatient, Telemetry bed   Nena Rebel, MD [facility] 07/17/2024, 10:52 AM

## 2024-07-17 NOTE — Progress Notes (Signed)
    Durable Medical Equipment  (From admission, onward)           Start     Ordered   07/17/24 1644  For home use only DME lightweight manual wheelchair with seat cushion  Once       Comments: Patient suffers from ovarian cancer and morbid obesity which impairs their ability to perform daily activities like bathing and grooming in the home.  A walker will not resolve  issue with performing activities of daily living. A wheelchair will allow patient to safely perform daily activities. Patient is not able to propel themselves in the home using a standard weight wheelchair due to general weakness. Patient can self propel in the lightweight wheelchair. Length of need Lifetime. Accessories: elevating leg rests (ELRs), wheel locks, extensions and anti-tippers.   07/17/24 1644

## 2024-07-17 NOTE — ED Provider Notes (Signed)
 [facility] Provider Note    Event Date/Time   First MD Initiated Contact with Patient 07/17/24 0244     (approximate)   History   Weakness and Vaginal Bleeding   HPI  Sydney Vaughan is a 83 y.o. female with history of atrial fibrillation on on Eliquis , TIA, obesity, history of ovarian cancer, BRCA positive status post bilateral mastectomy and total hysterectomy who presents to the emergency department with progressively worsening generalized weakness.  She reports she has been progressively getting weaker since a fall 4 months ago.  She lives at home with her son but states that he is not able to help get her up when she is unable to stand.  States tonight she went to the bathroom because she had some diarrhea and sat on the toilet too long and her legs got numb and she could not stand up and they had to call 911.  She denies any new falls.  She also reports having vaginal bleeding and has not followed up with OB/GYN for this.  She states it will stop after holding Eliquis  for a few days and then return.  No dysuria, vaginal discharge.  No numbness, tingling or focal weakness.  No chest pain or shortness of breath.  No vomiting.   History provided by patient, EMS.    Past Medical History:  Diagnosis Date   AMS (altered mental status) 03/22/2022   Anxiety    Atrial fibrillation with rapid ventricular response (HCC) 03/2022   New diagnosis setting of UTI and TIA.-Next seen on monitor.   Depression    History of cardioembolic stroke    History of tobacco abuse    Hyperlipidemia with target LDL less than 70    Unfortunately, not willing to take statin.  Did not start statin that was ordered in the hospital.  She is very fearful of side effects.   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 Authorizing Provider  atenolol  (TENORMIN ) 50 MG tablet Take 0.5 tablet (25 mg) in the morning, and 1 tablet (50 mg) in the evening. 09/07/23   Anner Alm ORN, MD  clonazePAM  (KLONOPIN ) 0.5 MG tablet Take 1 tablet (0.5 mg total) by mouth daily as needed. for anxiety 05/15/24   Simmons-Robinson, Rockie, MD  ELIQUIS  5 MG TABS tablet TAKE 1 TABLET BY MOUTH TWICE  DAILY 09/18/23   Anner Alm ORN, MD  nitrofurantoin , macrocrystal-monohydrate, (MACROBID ) 100 MG capsule Take 1 capsule (100 mg total) by mouth 2 (two) times daily. Patient not taking: Reported on 06/26/2024 10/30/23   Penne Knee, MD  sertraline  (ZOLOFT ) 25 MG tablet TAKE 2 TABLETS BY MOUTH DAILY 05/15/24   Sharma Rockie, MD    Physical Exam   Triage Vital Signs: ED Triage Vitals  Encounter Vitals Group     BP --      Girls Systolic BP Percentile --      Girls Diastolic BP Percentile --      Boys Systolic BP Percentile --      Boys Diastolic BP Percentile --  Pulse Rate 07/17/24 0244 84     Resp 07/17/24 0244 20     Temp 07/17/24 0244 98.7 F (37.1 C)     Temp Source 07/17/24 0244 Oral     SpO2 07/17/24 0241 99 %     Weight 07/17/24 0244 247 lb 2.2 oz (112.1 kg)     Height 07/17/24 0244 5' 6 (1.676 m)     Head Circumference --      Peak Flow --      Pain Score 07/17/24 0242 0     Pain Loc --      Pain Education --      Exclude from Growth Chart --     Most recent vital signs: Vitals:   07/17/24 0244 07/17/24 0249  BP:  114/79  Pulse: 84   Resp: 20   Temp: 98.7 F (37.1 C)   SpO2: 99%     CONSTITUTIONAL: Alert, responds appropriately to questions.  Elderly, morbidly obese HEAD: Normocephalic, atraumatic EYES: Conjunctivae clear, pupils appear equal, sclera nonicteric ENT: normal nose; moist mucous membranes NECK: Supple,  normal ROM CARD: Irregularly irregular and tachycardic; S1 and S2 appreciated RESP: Normal chest excursion without splinting or tachypnea; breath sounds clear and equal bilaterally; no wheezes, no rhonchi, no rales, no hypoxia or respiratory distress, speaking full sentences ABD/GI: Non-distended; soft, non-tender, no rebound, no guarding, no peritoneal signs GU: Patient has a 2 x 5 cm mass to the right vulva that is not actively bleeding and is nontender to palpation.  No blood coming from the introitus.  Internal exam deferred. BACK: The back appears normal EXT: Normal ROM in all joints; no deformity noted, no edema SKIN: Normal color for age and race; warm; no rash on exposed skin NEURO: Moves all extremities equally, normal speech, no facial asymmetry, normal sensation PSYCH: The patient's mood and manner are appropriate.   ED Results / Procedures / Treatments   LABS: (all labs ordered are listed, but only abnormal results are displayed) Labs Reviewed  CBC WITH DIFFERENTIAL/PLATELET - Abnormal; Notable for the following components:      Result Value   Monocytes Absolute 1.2 (*)    All other components within normal limits  COMPREHENSIVE METABOLIC PANEL WITH GFR - Abnormal; Notable for the following components:   Glucose, Bld 133 (*)    Creatinine, Ser 1.02 (*)    Calcium  8.6 (*)    Albumin 3.1 (*)    AST 13 (*)    GFR, Estimated 55 (*)    All other components within normal limits  TSH - Abnormal; Notable for the following components:   TSH 6.004 (*)    All other components within normal limits  MAGNESIUM  - Abnormal; Notable for the following components:   Magnesium  1.5 (*)    All other components within normal limits  URINALYSIS, W/ REFLEX TO CULTURE (INFECTION SUSPECTED) - Abnormal; Notable for the following components:   Color, Urine YELLOW (*)    APPearance HAZY (*)    Hgb urine dipstick MODERATE (*)    Leukocytes,Ua LARGE (*)    Bacteria, UA MANY (*)    All other  components within normal limits  PROTIME-INR - Abnormal; Notable for the following components:   Prothrombin Time 17.8 (*)    INR 1.4 (*)    All other components within normal limits  URINE CULTURE  T4, FREE  TYPE AND SCREEN  TROPONIN I (HIGH SENSITIVITY)     EKG:  EKG Interpretation Date/Time:  Wednesday July 17 2024  02:45:45 EDT Ventricular Rate:  109 PR Interval:    QRS Duration:  102 QT Interval:  352 QTC Calculation: 474 R Axis:   60  Text Interpretation: Atrial fibrillation Borderline repol abnrm, anterolateral leads Confirmed by Neomi Neptune [phone]) on 07/17/2024 2:47:25 AM         RADIOLOGY: My personal review and interpretation of imaging: CT head unremarkable.  I have personally reviewed all radiology reports.   CT HEAD WO CONTRAST ( ) Result Date: 07/17/2024 CLINICAL DATA:  Weakness EXAM: CT HEAD WITHOUT CONTRAST TECHNIQUE: Contiguous axial images were obtained from the base of the skull through the vertex without intravenous contrast. RADIATION DOSE REDUCTION: This exam was performed according to the departmental dose-optimization program which includes automated exposure control, adjustment of the mA and/or kV according to patient size and/or use of iterative reconstruction technique. 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 Chronic white matter ischemic changes are noted. No acute hemorrhage or acute infarct is seen. Vascular: No hyperdense vessel or unexpected calcification. Skull: Normal. Negative for fracture or focal lesion. Sinuses/Orbits: No acute finding. Other: None. IMPRESSION: Chronic changes without acute abnormality. Electronically Signed   By: Oneil Devonshire M.D.   On: 07/17/2024 03:26     PROCEDURES:  Critical Care performed: Yes, see critical care procedure note(s)   CRITICAL CARE Performed by: Neptune Layloni Fahrner   Total critical care time: 30 minutes  Critical care time was exclusive of  separately billable procedures and treating other patients.  Critical care was necessary to treat or prevent imminent or life-threatening deterioration.  Critical care was time spent personally by me on the following activities: development of treatment plan with patient and/or surrogate as well as nursing, discussions with consultants, evaluation of patient's response to treatment, examination of patient, obtaining history from patient or surrogate, ordering and performing treatments and interventions, ordering and review of laboratory studies, ordering and review of radiographic studies, pulse oximetry and re-evaluation of patient's condition.   SABRA1-3 Lead EKG Interpretation  Performed by: Aran Menning, Neptune SAILOR, DO Authorized by: Ayesha Markwell, Neptune SAILOR, DO     Interpretation: abnormal     ECG rate:  102   ECG rate assessment: tachycardic     Rhythm: atrial fibrillation     Ectopy: none     Conduction: normal       IMPRESSION / MDM / ASSESSMENT AND PLAN / ED COURSE  I reviewed the triage vital signs and the nursing notes.    Patient here with complaints of generalized weakness worsening over the past 4 months.  Also having bleeding from a mass to her vulva.  Has not followed up with OB/GYN.  The patient is on the cardiac monitor to evaluate for evidence of arrhythmia and/or significant heart rate changes.   DIFFERENTIAL DIAGNOSIS (includes but not limited to):   UTI, dehydration, anemia, electrolyte derangement, A-fib with RVR, thyroid  dysfunction, less likely ACS, PE, intracranial hemorrhage, stroke   Patient's presentation is most consistent with acute presentation with potential threat to life or bodily function.   PLAN: Will obtain labs, catheterized urine specimen, CT head.  Will give IV fluids.  She denies any pain.   MEDICATIONS GIVEN IN ED: Medications  magnesium  sulfate IVPB 2 g 50 mL (2 g Intravenous New Bag/Given 07/17/24 0644)  sodium chloride  0.9 % bolus 500 mL (0 mLs  Intravenous Stopped 07/17/24 0644)  cefTRIAXone  (ROCEPHIN ) 2 g in sodium chloride  0.9 % 100 mL IVPB (0 g Intravenous Stopped 07/17/24 0644)  ED COURSE: Patient's hemoglobin normal at 13.  Normal potassium.  Magnesium  is 1.5.  She is tachycardic and in atrial fibrillation but rate will improve spontaneously into the 70s to 80s.  Will continue to monitor.  Will replace her magnesium  here.  Elevated TSH but normal free T4.  Troponin negative.  Patient does appear to have a UTI.  Culture pending.  Will give Rocephin .  Discussed with patient that her hypomagnesemia and UTI could be contributing to her worsening weakness and inability to stand today.  Recommended admission for treatment and physical therapy.  She agrees.   CONSULTS:  Consulted and discussed patient's case with hospitalist, Dr. Lawence.  I have recommended admission and consulting physician agrees and will place admission orders.  Patient (and family if present) agree with this plan.   I reviewed all nursing notes, vitals, pertinent previous records.  All labs, EKGs, imaging ordered have been independently reviewed and interpreted by myself.    OUTSIDE RECORDS REVIEWED: Reviewed recent family medicine notes.       FINAL CLINICAL IMPRESSION(S) / ED DIAGNOSES   Final diagnoses:  Vulvar mass  Generalized weakness  Hypomagnesemia  Acute UTI     Rx / DC Orders   ED Discharge Orders     None        Note:  This document was prepared using Dragon voice recognition software and may include unintentional dictation errors.   Kaleab Frasier, Josette SAILOR, DO 07/17/24 [phone]

## 2024-07-17 NOTE — ED Triage Notes (Signed)
 Chief Complaint  Patient presents with   Weakness   Vaginal Bleeding   Pt reports weakness that began within the last few days. Reports she has not been able to ambulate per her normal. Pt reports that she has been experiencing vaginal bleeding for the last 10 months. Pt reports she is experiencing vaginal bleeding currently after using the bathroom today. Pt states that I got on the toilet today and wasn't able to get off, my legs become numb because I sat on the toilet too long.  Past Medical History:  Diagnosis Date   AMS (altered mental status) 03/22/2022   Anxiety    Atrial fibrillation with rapid ventricular response (HCC) 03/2022   New diagnosis setting of UTI and TIA.-Next seen on monitor.   Depression    History of cardioembolic stroke    History of tobacco abuse    Hyperlipidemia with target LDL less than 70    Unfortunately, not willing to take statin.  Did not start statin that was ordered in the hospital.  She is very fearful of side effects.   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

## 2024-07-17 NOTE — Care Management CC44 (Signed)
 Condition Code 44 Documentation Completed  Patient Details  Name: Sydney Vaughan MRN: [id number] Date of Birth: 1941/11/02   Condition Code 44 given:  Yes Patient signature on Condition Code 44 notice:  Yes Documentation of 2 MD's agreement:  Yes Code 44 added to claim:  Yes    Corean ONEIDA Haddock, RN 07/17/2024, 4:40 PM

## 2024-07-17 NOTE — Consult Note (Signed)
 Consult History and Physical   SERVICE: Gynecology   Patient Name: Sydney Vaughan Patient MRN:   [id number]  CC: Sydney Vaughan is a 83 y.o. Sydney Vaughan was brought to the ER for weakness.  She also reports vaginal bleeding for the last 10 months since starting Eliquis .  Sydney Vaughan reports that the vaginal bleeding is on and off and is also not affected by whether she is taking her Eliquis  or not. The bleeding is only noted on toilet paper and not on pads.  She has a recent history of multiple UTIs with Abx treatment.  Per Sydney Vaughan, her urologist did a speculum exam and saw a polyp.  She has incontinence and wears pads daily.  She also has a history of ovarian CA, and a BRCA1 carrier with bilateral mastectomy,   Past Obstetrical History: OB History     Gravida  3   Para  3   Term      Preterm      AB      Living         SAB      IAB      Ectopic      Multiple      Live Births              Past Gynecologic History: No LMP recorded. Patient is postmenopausal.   Past Medical History: Past Medical History:  Diagnosis Date   AMS (altered mental status) 03/22/2022   Anxiety    Atrial fibrillation with rapid ventricular response (HCC) 03/2022   New diagnosis setting of UTI and TIA.-Next seen on monitor.   Depression    History of cardioembolic stroke    History of tobacco abuse    Hyperlipidemia with target LDL less than 70    Unfortunately, not willing to take statin.  Did not start statin that was ordered in the hospital.  She is very fearful of side effects.   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  Social History:  Social History   Socioeconomic History   Marital status: Widowed    Spouse name: Not on file   Number of children: Not on file   Years of education: Not on file   Highest education level: Not on file  Occupational History   Not on file  Tobacco Use   Smoking status: Former    Types: Cigarettes   Smokeless tobacco: Never  Substance and Sexual Activity   Alcohol use: Not Currently   Drug use: Never   Sexual activity: Not Currently  Other Topics Concern   Not on file  Social History Narrative   Very upset.  Daughter is dying of cancer.  She feels very guilty.   Cries a lot.   Also, recently moved out of her house to downsize after living there for 40 years.  Been quite emotional event.  Lots of social stress.   Social Drivers of Health   Financial Resource Strain: Low Risk  (06/26/2024)  Overall Financial Resource Strain (CARDIA)    Difficulty of Paying Living Expenses: Not hard at all  Food Insecurity: No Food Insecurity (07/17/2024)   Hunger Vital Sign    Worried About Running Out of Food in the Last Year: Never true    Ran Out of Food in the Last Year: Never true  Transportation Needs: No Transportation Needs (07/17/2024)   PRAPARE - Administrator, Civil Service (Medical): No    Lack of Transportation (Non-Medical): No  Physical Activity: Inactive (06/26/2024)   Exercise Vital Sign    Days of Exercise per Week: 0 days    Minutes of Exercise per Session: 0 min  Stress: No Stress Concern Present (06/26/2024)   [employer] of Occupational Health - Occupational Stress Questionnaire    Feeling of Stress: Not at all  Social Connections: Socially Isolated (07/17/2024)   Social Connection and Isolation Panel    Frequency of Communication with Friends and Family: More than three times a week    Frequency of Social Gatherings with Friends and Family: Never    Attends Religious Services:  Never    Database administrator or Organizations: No    Attends Banker Meetings: Never    Marital Status: Widowed  Intimate Partner Violence: Not At Risk (07/17/2024)   Humiliation, Afraid, Rape, and Kick questionnaire    Fear of Current or Ex-Partner: No    Emotionally Abused: No    Physically Abused: No    Sexually Abused: No    Home Medications:  Medications reconciled in EPIC  No current facility-administered medications on file prior to encounter.   Current Outpatient Medications on File Prior to Encounter  Medication Sig Dispense Refill   atenolol  (TENORMIN ) 50 MG tablet Take 0.5 tablet (25 mg) in the morning, and 1 tablet (50 mg) in the evening. 200 tablet 2   clonazePAM  (KLONOPIN ) 0.5 MG tablet Take 1 tablet (0.5 mg total) by mouth daily as needed. for anxiety 30 tablet 2   ELIQUIS  5 MG TABS tablet TAKE 1 TABLET BY MOUTH TWICE  DAILY 140 tablet 4   sertraline  (ZOLOFT ) 25 MG tablet TAKE 2 TABLETS BY MOUTH DAILY 180 tablet 3   nitrofurantoin , macrocrystal-monohydrate, (MACROBID ) 100 MG capsule Take 1 capsule (100 mg total) by mouth 2 (two) times daily. (Patient not taking: Reported on 06/26/2024) 20 capsule 0    Allergies:  No Known Allergies  Physical Exam:  Temp:  [97.7 F (36.5 C)-98.7 F (37.1 C)] 97.7 F (36.5 C) (08/27 1135) Pulse Rate:  [54-107] 72 (08/27 1135) Resp:  [16-20] 16 (08/27 1135) BP: (114-127)/(65-104) 115/65 (08/27 1135) SpO2:  [99 %-100 %] 100 % (08/27 1135) Weight:  [112.1 kg] 112.1 kg (08/27 0244)   General Appearance:  Well developed, well nourished, no acute distress, alert and oriented x3 Pelvic:  NEFG, no vulvar masses or lesions, vaginal bleeding noted on swab    Labs/Studies:   CBC and Coags:  Lab Results  Component Value Date   WBC 8.9 07/17/2024   NEUTOPHILPCT 69 07/17/2024   EOSPCT 1 07/17/2024   BASOPCT 0 07/17/2024   LYMPHOPCT 15 07/17/2024   HGB 13.1 07/17/2024   HCT 39.9 07/17/2024   MCV 95.9 07/17/2024    PLT 203 07/17/2024   INR 1.4 (H) 07/17/2024   CMP:  Lab Results  Component Value Date   NA 141 07/17/2024   K 3.5 07/17/2024   CL 105 07/17/2024   CO2 26 07/17/2024   BUN 16 07/17/2024  CREATININE 1.02 (H) 07/17/2024   CREATININE 0.93 03/30/2024   CREATININE 1.09 (H) 02/22/2023   PROT 6.9 07/17/2024   BILITOT 0.7 07/17/2024   ALT 8 07/17/2024   AST 13 (L) 07/17/2024   ALKPHOS 82 07/17/2024    Other Imaging: CT HEAD WO CONTRAST ( ) Result Date: 07/17/2024 CLINICAL DATA:  Weakness EXAM: CT HEAD WITHOUT CONTRAST TECHNIQUE: Contiguous axial images were obtained from the base of the skull through the vertex without intravenous contrast. RADIATION DOSE REDUCTION: This exam was performed according to the departmental dose-optimization program which includes automated exposure control, adjustment of the mA and/or kV according to patient size and/or use of iterative reconstruction technique. 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 Chronic white matter ischemic changes are noted. No acute hemorrhage or acute infarct is seen. Vascular: No hyperdense vessel or unexpected calcification. Skull: Normal. Negative for fracture or focal lesion. Sinuses/Orbits: No acute finding. Other: None. IMPRESSION: Chronic changes without acute abnormality. Electronically Signed   By: Oneil Devonshire M.D.   On: 07/17/2024 03:26     Assessment / Plan:   Sydney Vaughan is a 83 y.o. G3P3 who presents with vaginal bleeding  1. Vaginal Bleeding - Wet prep collected - treat as appropriate - Differential Dx: vaginal infection, postmenopausal vaginal atrophy, use of blood thinner, return of cancer  - She will need to follow up out patient with our Clinic, clinic details noted in discharge paperwork   Thank you for the opportunity to be involved with this pt's care.

## 2024-07-17 NOTE — Evaluation (Signed)
 Occupational Therapy Evaluation Patient Details Name: Sydney Vaughan MRN: [id number] DOB: December 22, 1940 Today's Date: 07/17/2024   History of Present Illness   Sydney Vaughan is a pleasant 83 y.o. female with medical history significant for atrial fibrillation on Eliquis , TIA, obesity, history of ovarian cancer s/p hysterectomy, BRCA positive s/p bilateral mastectomy, morbid obesity who presented to ED with progressively worsening generalized weakness, intermittent vaginal bleeding     Clinical Impressions Patient presenting with decreased Ind in self care, balance, functional mobility/transfers, endurance, and safety awareness. Patient reports living at home with son who has been assisting her. She has needed assistance for self care and mobility for the last 4 months. Prior to that pt reports Independence. Pt is very anxious during session but vitals remains WNLs during session when monitored.  Patient currently functioning at min A of 2 for bed mobility and side steps with RW. Patient will benefit from acute OT to increase overall independence in the areas of ADLs, functional mobility, and safety awareness in order to safely discharge.     If plan is discharge home, recommend the following:   A lot of help with walking and/or transfers;A lot of help with bathing/dressing/bathroom;Assistance with cooking/housework;Help with stairs or ramp for entrance     Functional Status Assessment   Patient has had a recent decline in their functional status and demonstrates the ability to make significant improvements in function in a reasonable and predictable amount of time.     Equipment Recommendations   Wheelchair (measurements OT);Wheelchair cushion (measurements OT)      Precautions/Restrictions   Precautions Precautions: Fall Recall of Precautions/Restrictions: Impaired Restrictions Weight Bearing Restrictions Per Provider Order: No     Mobility Bed Mobility Overal bed mobility:  Needs Assistance Bed Mobility: Supine to Sit, Sit to Supine     Supine to sit: Min assist Sit to supine: Supervision   General bed mobility comments: patient requires cues for proper techinque for bed mobility and transfers. With correct technique she required min +1-2 assistance for basic mobility    Transfers Overall transfer level: Needs assistance Equipment used: Rolling walker (2 wheels) Transfers: Sit to/from Stand Sit to Stand: Min assist, +2 physical assistance           General transfer comment: min A +2 with cues for hand placement.      Balance Overall balance assessment: Needs assistance Sitting-balance support: Feet supported Sitting balance-Leahy Scale: Good     Standing balance support: Bilateral upper extremity supported, During functional activity, Reliant on assistive device for balance Standing balance-Leahy Scale: Fair                             ADL either performed or assessed with clinical judgement   ADL Overall ADL's : Needs assistance/impaired                         Toilet Transfer: +2 for physical assistance;Minimal assistance Toilet Transfer Details (indicate cue type and reason): simulated                 Vision Patient Visual Report: No change from baseline              Pertinent Vitals/Pain Pain Assessment Pain Assessment: No/denies pain     Extremity/Trunk Assessment Upper Extremity Assessment Upper Extremity Assessment: Generalized weakness   Lower Extremity Assessment Lower Extremity Assessment: Generalized weakness   Cervical / Trunk Assessment Cervical /  Trunk Assessment: Normal   Communication Communication Communication: No apparent difficulties Factors Affecting Communication: Other (comment) (very talkative)   Cognition Arousal: Alert Behavior During Therapy: Anxious, WFL for tasks assessed/performed Cognition: No apparent impairments                                Following commands: Intact       Cueing  General Comments   Cueing Techniques: Verbal cues;Gestural cues              Home Living Family/patient expects to be discharged to:: Private residence Living Arrangements: Children Available Help at Discharge: Family;Available 24 hours/day Type of Home: Apartment Home Access: Level entry     Home Layout: One level     Bathroom Shower/Tub: Sponge bathes at baseline (uses wipes)         Home Equipment: Rolling Walker (2 wheels);Electric scooter          Prior Functioning/Environment Prior Level of Function : Needs assist       Physical Assist : Mobility (physical);ADLs (physical) Mobility (physical): Transfers;Gait   Mobility Comments: patient requires son's assistance with transfers and ambulation. ADLs Comments: requires assistance at baseline    OT Problem List: Decreased strength;Impaired balance (sitting and/or standing);Decreased safety awareness;Decreased activity tolerance;Decreased knowledge of use of DME or AE   OT Treatment/Interventions: Self-care/ADL training;Therapeutic exercise;Patient/family education;Balance training;Energy conservation;Therapeutic activities      OT Goals(Current goals can be found in the care plan section)   Acute Rehab OT Goals Patient Stated Goal: to go home OT Goal Formulation: With patient Time For Goal Achievement: 07/31/24 Potential to Achieve Goals: Fair ADL Goals Pt Will Perform Grooming: with supervision;standing Pt Will Perform Lower Body Dressing: with supervision;sit to/from stand Pt Will Transfer to Toilet: with supervision;ambulating Pt Will Perform Toileting - Clothing Manipulation and hygiene: with supervision;sit to/from stand   OT Frequency:  Min 2X/week    Co-evaluation PT/OT/SLP Co-Evaluation/Treatment: Yes Reason for Co-Treatment: For patient/therapist safety;To address functional/ADL transfers PT goals addressed during session: Mobility/safety  with mobility;Balance;Proper use of DME;Strengthening/ROM OT goals addressed during session: ADL's and self-care      AM-PAC OT 6 Clicks Daily Activity     Outcome Measure Help from another person eating meals?: None Help from another person taking care of personal grooming?: A Little Help from another person toileting, which includes using toliet, bedpan, or urinal?: A Little Help from another person bathing (including washing, rinsing, drying)?: A Little Help from another person to put on and taking off regular upper body clothing?: A Lot Help from another person to put on and taking off regular lower body clothing?: A Lot 6 Click Score: 17   End of Session Equipment Utilized During Treatment: Rolling walker (2 wheels)  Activity Tolerance: Patient tolerated treatment well Patient left: in bed;with bed alarm set  OT Visit Diagnosis: Unsteadiness on feet (R26.81);Repeated falls (R29.6);Muscle weakness (generalized) (M62.81)                Time: 8685-8662 OT Time Calculation (min): 23 min Charges:  OT General Charges $OT Visit: 1 Visit OT Evaluation $OT Eval Moderate Complexity: 1 [address], [place], OTR/L , CBIS ascom [phone]  07/17/24, 3:08 PM

## 2024-07-17 NOTE — Evaluation (Signed)
 Physical Therapy Evaluation Patient Details Name: Sydney Vaughan MRN: [id number] DOB: 1941-10-25 Today's Date: 07/17/2024  History of Present Illness  Sydney Vaughan is a pleasant 83 y.o. female with medical history significant for atrial fibrillation on Eliquis , TIA, obesity, history of ovarian cancer s/p hysterectomy, BRCA positive s/p bilateral mastectomy, morbid obesity who presented to ED with progressively worsening generalized weakness, intermittent vaginal bleeding   Clinical Impression  Patient received in bed, she is quite talkative. She is agreeable to PT/OT session. Patient requires min A for bed mobility with use of bed rail and proper technique. She is able to stand with min A +2 and took a few side steps at edge of bed. She is anxious with mobility. HR and O2 sats normal throughout session. She will continue to benefit from skilled PT to improve functional independence, strength and safety with mobility.          If plan is discharge home, recommend the following: A lot of help with walking and/or transfers;A lot of help with bathing/dressing/bathroom;Assist for transportation   Can travel by private vehicle    no    Equipment Recommendations Wheelchair (measurements PT)  Recommendations for Other Services       Functional Status Assessment Patient has had a recent decline in their functional status and demonstrates the ability to make significant improvements in function in a reasonable and predictable amount of time.     Precautions / Restrictions Precautions Precautions: Fall Recall of Precautions/Restrictions: Impaired Restrictions Weight Bearing Restrictions Per Provider Order: No      Mobility  Bed Mobility Overal bed mobility: Needs Assistance Bed Mobility: Supine to Sit, Sit to Supine     Supine to sit: Min assist Sit to supine: Supervision   General bed mobility comments: patient requires cues for proper techinque for bed mobility and transfers. With  correct technique she required min +1-2 assistance for basic mobility    Transfers Overall transfer level: Needs assistance Equipment used: Rolling walker (2 wheels) Transfers: Sit to/from Stand Sit to Stand: Min assist, +2 physical assistance           General transfer comment: min A +2 with cues for hand placement.    Ambulation/Gait Ambulation/Gait assistance: Min assist, +2 physical assistance Gait Distance (Feet): 3 Feet Assistive device: Rolling walker (2 wheels) Gait Pattern/deviations: Step-to pattern Gait velocity: decr     General Gait Details: patient was able to take a few side steps with RW and +2 min A. Fatigued with this.  Stairs            Wheelchair Mobility     Tilt Bed    Modified Rankin (Stroke Patients Only)       Balance Overall balance assessment: Needs assistance Sitting-balance support: Feet supported Sitting balance-Leahy Scale: Good     Standing balance support: Bilateral upper extremity supported, During functional activity, Reliant on assistive device for balance Standing balance-Leahy Scale: Fair                               Pertinent Vitals/Pain Pain Assessment Pain Assessment: No/denies pain    Home Living Family/patient expects to be discharged to:: Private residence Living Arrangements: Children Available Help at Discharge: Family;Available 24 hours/day Type of Home: Apartment Home Access: Level entry       Home Layout: One level Home Equipment: Agricultural consultant (2 wheels);Electric scooter      Prior Function Prior Level of Function :  Needs assist       Physical Assist : Mobility (physical);ADLs (physical) Mobility (physical): Transfers;Gait   Mobility Comments: patient requires son's assistance with transfers and ambulation. ADLs Comments: requires assistance at baseline     Extremity/Trunk Assessment   Upper Extremity Assessment Upper Extremity Assessment: Defer to OT evaluation     Lower Extremity Assessment Lower Extremity Assessment: Generalized weakness    Cervical / Trunk Assessment Cervical / Trunk Assessment: Normal  Communication   Communication Communication: No apparent difficulties Factors Affecting Communication: Other (comment) (very talkative)    Cognition Arousal: Alert Behavior During Therapy: Anxious, WFL for tasks assessed/performed   PT - Cognitive impairments: Problem solving, Safety/Judgement                         Following commands: Intact       Cueing Cueing Techniques: Verbal cues, Gestural cues     General Comments      Exercises     Assessment/Plan    PT Assessment Patient needs continued PT services  PT Problem List Decreased strength;Decreased activity tolerance;Decreased balance;Decreased mobility;Decreased knowledge of use of DME;Decreased safety awareness;Obesity;Other (comment) (anxiety)       PT Treatment Interventions DME instruction;Gait training;Functional mobility training;Therapeutic activities;Therapeutic exercise;Balance training;Patient/family education;Wheelchair mobility training    PT Goals (Current goals can be found in the Care Plan section)  Acute Rehab PT Goals Patient Stated Goal: return home, get back to walking PT Goal Formulation: With patient Time For Goal Achievement: 07/31/24 Potential to Achieve Goals: Good    Frequency Min 2X/week     Co-evaluation PT/OT/SLP Co-Evaluation/Treatment: Yes Reason for Co-Treatment: For patient/therapist safety;To address functional/ADL transfers PT goals addressed during session: Mobility/safety with mobility;Balance;Proper use of DME;Strengthening/ROM         AM-PAC PT 6 Clicks Mobility  Outcome Measure Help needed turning from your back to your side while in a flat bed without using bedrails?: A Lot Help needed moving from lying on your back to sitting on the side of a flat bed without using bedrails?: A Lot Help needed moving  to and from a bed to a chair (including a wheelchair)?: A Lot Help needed standing up from a chair using your arms (e.g., wheelchair or bedside chair)?: A Lot Help needed to walk in hospital room?: A Lot Help needed climbing 3-5 steps with a railing? : Total 6 Click Score: 11    End of Session   Activity Tolerance: Patient limited by fatigue Patient left: in bed;with call bell/phone within reach;with bed alarm set Nurse Communication: Mobility status PT Visit Diagnosis: Other abnormalities of gait and mobility (R26.89);Muscle weakness (generalized) (M62.81);Difficulty in walking, not elsewhere classified (R26.2)    Time: 8685-8662 PT Time Calculation (min) (ACUTE ONLY): 23 min   Charges:   PT Evaluation $PT Eval Moderate Complexity: 1 Mod   PT General Charges $$ ACUTE PT VISIT: 1 Visit        Taresa [place], PT, GCS 07/17/24,1:56 PM

## 2024-07-17 NOTE — Care Management Obs Status (Signed)
 MEDICARE OBSERVATION STATUS NOTIFICATION   Patient Details  Name: Sydney Vaughan MRN: [id number] Date of Birth: 04-03-1941   Medicare Observation Status Notification Given:  Yes    Corean ONEIDA Haddock, RN 07/17/2024, 4:40 PM

## 2024-07-18 DIAGNOSIS — N390 Urinary tract infection, site not specified: Secondary | ICD-10-CM | POA: Diagnosis not present

## 2024-07-18 DIAGNOSIS — A4150 Gram-negative sepsis, unspecified: Secondary | ICD-10-CM | POA: Diagnosis not present

## 2024-07-18 LAB — COMPREHENSIVE METABOLIC PANEL WITH GFR
ALT: 6 U/L (ref 0–44)
AST: 12 U/L — ABNORMAL LOW (ref 15–41)
Albumin: 2.6 g/dL — ABNORMAL LOW (ref 3.5–5.0)
Alkaline Phosphatase: 62 U/L (ref 38–126)
Anion gap: 5 (ref 5–15)
BUN: 16 mg/dL (ref 8–23)
CO2: 26 mmol/L (ref 22–32)
Calcium: 8.2 mg/dL — ABNORMAL LOW (ref 8.9–10.3)
Chloride: 109 mmol/L (ref 98–111)
Creatinine, Ser: 0.79 mg/dL (ref 0.44–1.00)
GFR, Estimated: >60 mL/min (ref >60)
Glucose, Bld: 101 mg/dL — ABNORMAL HIGH (ref 70–99)
Potassium: 3.7 mmol/L (ref 3.5–5.1)
Sodium: 140 mmol/L (ref 135–145)
Total Bilirubin: 0.9 mg/dL (ref 0.0–1.2)
Total Protein: 5.9 g/dL — ABNORMAL LOW (ref 6.5–8.1)

## 2024-07-18 LAB — PROTIME-INR
INR: 1.3 — ABNORMAL HIGH (ref 0.8–1.2)
Prothrombin Time: 17.4 s — ABNORMAL HIGH (ref 11.4–15.2)

## 2024-07-18 LAB — CBC
HCT: 33 % — ABNORMAL LOW (ref 36.0–46.0)
Hemoglobin: 10.9 g/dL — ABNORMAL LOW (ref 12.0–15.0)
MCH: 31.6 pg (ref 26.0–34.0)
MCHC: 33 g/dL (ref 30.0–36.0)
MCV: 95.7 fL (ref 80.0–100.0)
Platelets: 138 K/uL — ABNORMAL LOW (ref 150–400)
RBC: 3.45 MIL/uL — ABNORMAL LOW (ref 3.87–5.11)
RDW: 13.2 % (ref 11.5–15.5)
WBC: 6.3 K/uL (ref 4.0–10.5)
nRBC: 0 % (ref 0.0–0.2)

## 2024-07-18 LAB — MAGNESIUM: Magnesium: 2 mg/dL (ref 1.7–2.4)

## 2024-07-18 LAB — GLUCOSE, CAPILLARY: Glucose-Capillary: 151 mg/dL — ABNORMAL HIGH (ref 70–99)

## 2024-07-18 MED ORDER — CLONAZEPAM 0.5 MG PO TABS: 0.5000 mg | ORAL_TABLET | Freq: Every day | ORAL | Status: DC | PRN

## 2024-07-18 NOTE — Progress Notes (Signed)
  PROGRESS NOTE    Sydney Vaughan  FMW:[id number] DOB: [birth year] DOA: 07/17/2024 PCP: Sharma Coyer, MD  [id number]  LOS: 1 day   Brief hospital course:   Assessment & Plan: Sydney Vaughan is a pleasant 83 y.o. female with medical history significant for atrial fibrillation on Eliquis , TIA, obesity, history of ovarian cancer s/p hysterectomy, BRCA positive s/p bilateral mastectomy, morbid obesity who presented to ED with progressively worsening generalized weakness, intermittent vaginal bleeding.  Patient lives at home with her son but stated that he is not able to help get her up when she is unable to stand.    1.  Urinary tract infection vs hemorrhagic cystitis - Patient used to have intermittent bleeding, mixed with urine - She stated that she has seen urologist and they advised for antibiotics for UTI otherwise no further intervention. - She was started on ceftriaxone  in the emergency room.  Sent to cultures. --seen by inpatient ObGyn, wet prep obtained, neg. --cont ceftriaxone  pending urine cx --need to f/u with Gyn outpatient   2.  Generalized weakness/deconditioning --pt wishes to go home with [facility] and wheel chair.   3.  Paroxysmal Afib - Heart rate has been well-controlled. --cont atenolol  and Eliquis    5.  Hypomagnesemia --monitor and supplement PRN   DVT prophylaxis: On:Eliquis  Code Status: Full code  Family Communication:  Level of care: Telemetry Medical Dispo:   The patient is from: home Anticipated d/c is to: home Anticipated d/c date is: 1-2 days   Subjective and Interval History:  Pt reported vaginal itching.     Objective: Vitals:   07/17/24 2124 07/18/24 0427 07/18/24 0820 07/18/24 1602  BP: 101/68 106/65 123/70 118/88  Pulse: 93 84 97 94  Resp: 16 16 17 16   Temp: 97.9 F (36.6 C) 97.8 F (36.6 C) 98 F (36.7 C) 97.9 F (36.6 C)  TempSrc: Oral Oral Oral   SpO2: 100% 99% 98% 100%  Weight:      Height:        Intake/Output  Summary (Last 24 hours) at 07/18/2024 1724 Last data filed at 07/18/2024 1655 Gross per 24 hour  Intake 460 ml  Output 1050 ml  Net -590 ml   Filed Weights   07/17/24 0244  Weight: 112.1 kg    Examination:   Constitutional: NAD, AAOx3 HEENT: conjunctivae and lids normal, EOMI CV: No cyanosis.   RESP: normal respiratory effort, on RA Neuro: II - XII grossly intact.   Psych: Normal mood and affect.  Appropriate judgement and reason   Data Reviewed: I have personally reviewed labs and imaging studies  Time spent: 50 minutes  Sydney Haber, MD [facility] If 7PM-7AM, please contact night-coverage 07/18/2024, 5:24 PM

## 2024-07-18 NOTE — Progress Notes (Signed)
 Occupational Therapy Treatment Patient Details Name: Sydney Vaughan MRN: [id number] DOB: Nov 22, 1940 Today's Date: 07/18/2024   History of present illness Sydney Vaughan is a pleasant 83 y.o. female with medical history significant for atrial fibrillation on Eliquis , TIA, obesity, history of ovarian cancer s/p hysterectomy, BRCA positive s/p bilateral mastectomy, morbid obesity who presented to ED with progressively worsening generalized weakness, intermittent vaginal bleeding   OT comments  Pt seen for OT treatment on this date. Upon arrival to room pt seated in recliner chair, agreeable to tx. Pt requires set up for seated grooming/hygiene/oral care tasks at table top (refused standing at sink to complete).  Pt completed sit to stand transfers to RW with Min A with increased time to process safe transfer sequencing for hand/foot placement to increase safety and independence with task engagement. Pt had new wheelchair in room reporting that the DME was provided today for a possible d/c home tomorrow.  Pt making good progress toward goals, will continue to follow POC. Discharge recommendation remains appropriate.        If plan is discharge home, recommend the following:  A lot of help with walking and/or transfers;A lot of help with bathing/dressing/bathroom;Assistance with cooking/housework;Help with stairs or ramp for entrance   Equipment Recommendations  Wheelchair (measurements OT);Wheelchair cushion (measurements OT)    Recommendations for Other Services      Precautions / Restrictions Precautions Precautions: Fall Recall of Precautions/Restrictions: Impaired Restrictions Weight Bearing Restrictions Per Provider Order: No       Mobility Bed Mobility               General bed mobility comments: Sitting in recliner at start and end of session    Transfers   Equipment used: Rolling walker (2 wheels) Transfers: Sit to/from Stand Sit to Stand: Min assist            General transfer comment: Min A wtih verbal, gestural, and tactile cuing for hand placement     Balance Overall balance assessment: Needs assistance Sitting-balance support: Feet supported Sitting balance-Leahy Scale: Good     Standing balance support: Bilateral upper extremity supported, During functional activity, Reliant on assistive device for balance Standing balance-Leahy Scale: Fair Standing balance comment: Very fearful of falling, increased time required to process instruction for safe transfers                           ADL either performed or assessed with clinical judgement   ADL Overall ADL's : Needs assistance/impaired     Grooming: Wash/dry face;Wash/dry hands;Oral care;Sitting                   Toilet Transfer: Rolling walker (2 wheels);Minimal assistance Toilet Transfer Details (indicate cue type and reason): simulated with sit to stand transfers to [facility]                Extremity/Trunk Assessment Upper Extremity Assessment Upper Extremity Assessment: Generalized weakness            Vision Patient Visual Report: No change from baseline     Perception     Praxis     Communication Communication Communication: No apparent difficulties   Cognition Arousal: Alert Behavior During Therapy: Anxious, WFL for tasks assessed/performed Cognition: No apparent impairments                               Following commands: Intact  Cueing   Cueing Techniques: Verbal cues, Gestural cues, Tactile cues  Exercises Other Exercises Other Exercises: Education on hand/foot placement during ADL transfers, education on energy conservation techniques during ADL task engagement as pt reports she may d/c home tomorrow.  Discussed home safety and fall risk management strategies.    Shoulder Instructions       General Comments      Pertinent Vitals/ Pain       Pain Assessment Pain Assessment: No/denies pain  Home Living                                           Prior Functioning/Environment              Frequency  Min 2X/week        Progress Toward Goals  OT Goals(current goals can now be found in the care plan section)  Progress towards OT goals: Progressing toward goals  Acute Rehab OT Goals Potential to Achieve Goals: Fair  Plan      Co-evaluation                 AM-PAC OT 6 Clicks Daily Activity     Outcome Measure   Help from another person eating meals?: None Help from another person taking care of personal grooming?: A Little Help from another person toileting, which includes using toliet, bedpan, or urinal?: A Little Help from another person bathing (including washing, rinsing, drying)?: A Little Help from another person to put on and taking off regular upper body clothing?: A Little Help from another person to put on and taking off regular lower body clothing?: A Lot 6 Click Score: 18    End of Session Equipment Utilized During Treatment: Rolling walker (2 wheels)  OT Visit Diagnosis: Unsteadiness on feet (R26.81);Repeated falls (R29.6);Muscle weakness (generalized) (M62.81)   Activity Tolerance Patient tolerated treatment well   Patient Left in chair;with call bell/phone within reach;with chair alarm set   Nurse Communication Mobility status        Time: 8667-8646 OT Time Calculation (min): 21 min  Charges: OT General Charges $OT Visit: 1 Visit OT Treatments $Self Care/Home Management : 8-22 mins  Sydney Vaughan   Sydney Vaughan 07/18/2024, 2:01 PM

## 2024-07-18 NOTE — Progress Notes (Signed)
 Physical Therapy Treatment Patient Details Name: Sydney Vaughan MRN: [id number] DOB: [birth year] Today's Date: 07/18/2024   History of Present Illness BARI LEIB is a pleasant 83 y.o. female with medical history significant for atrial fibrillation on Eliquis , TIA, obesity, history of ovarian cancer s/p hysterectomy, BRCA positive s/p bilateral mastectomy, morbid obesity who presented to ED with progressively worsening generalized weakness, intermittent vaginal bleeding    PT Comments  Patient received in bed, NT in room. Patient just returned to bed with NT assist. She is agreeable to LE exercises. PAtient with good tolerance and motivation for exercises. She will continue to benefit from skilled PT to improve strength and functional independence.         If plan is discharge home, recommend the following: A lot of help with walking and/or transfers;A lot of help with bathing/dressing/bathroom;Assist for transportation   Can travel by private vehicle      With assist  Equipment Recommendations    N/a received wheelchair today   Recommendations for Other Services  HHPT     Precautions / Restrictions Precautions Precautions: Fall Restrictions Weight Bearing Restrictions Per Provider Order: No     Mobility  Bed Mobility               General bed mobility comments: Patient declined as she had just returned to bed after sitting in recliner today.    Transfers                   General transfer comment: declined    Ambulation/Gait                   Stairs             Wheelchair Mobility     Tilt Bed    Modified Rankin (Stroke Patients Only)       Balance                                            Communication Communication Communication: No apparent difficulties  Cognition Arousal: Alert Behavior During Therapy: WFL for tasks assessed/performed   PT - Cognitive impairments: No apparent impairments                                 Cueing Cueing Techniques: Verbal cues  Exercises Other Exercises Other Exercises: B LE exercises: AP, heel slides, SLR, hip adb/add x 10 reps each- good tolerance    General Comments        Pertinent Vitals/Pain Pain Assessment Pain Assessment: No/denies pain    Home Living                          Prior Function            PT Goals (current goals can now be found in the care plan section) Acute Rehab PT Goals Patient Stated Goal: return home, get back to walking PT Goal Formulation: With patient Time For Goal Achievement: 07/31/24 Potential to Achieve Goals: Good Progress towards PT goals: Progressing toward goals    Frequency    Min 2X/week      PT Plan      Co-evaluation              AM-PAC PT 6 Clicks Mobility  Outcome Measure  Help needed turning from your back to your side while in a flat bed without using bedrails?: A Lot Help needed moving from lying on your back to sitting on the side of a flat bed without using bedrails?: A Lot Help needed moving to and from a bed to a chair (including a wheelchair)?: A Lot Help needed standing up from a chair using your arms (e.g., wheelchair or bedside chair)?: A Lot Help needed to walk in hospital room?: A Lot Help needed climbing 3-5 steps with a railing? : Total 6 Click Score: 11    End of Session   Activity Tolerance: Patient tolerated treatment well Patient left: in bed;with bed alarm set;with call bell/phone within reach Nurse Communication: Mobility status PT Visit Diagnosis: Other abnormalities of gait and mobility (R26.89);Muscle weakness (generalized) (M62.81);Difficulty in walking, not elsewhere classified (R26.2)     Time: 8464-8447 PT Time Calculation (min) (ACUTE ONLY): 17 min  Charges:    $Therapeutic Exercise: 8-22 mins PT General Charges $$ ACUTE PT VISIT: 1 Visit                     Christol Thetford, PT, GCS 07/18/24,4:02 PM

## 2024-07-19 ENCOUNTER — Other Ambulatory Visit: Payer: Self-pay

## 2024-07-19 DIAGNOSIS — N390 Urinary tract infection, site not specified: Secondary | ICD-10-CM | POA: Diagnosis not present

## 2024-07-19 DIAGNOSIS — A4150 Gram-negative sepsis, unspecified: Secondary | ICD-10-CM | POA: Diagnosis not present

## 2024-07-19 LAB — URINE CULTURE: Culture: >=100000 — AB

## 2024-07-19 MED ORDER — AMOXICILLIN 500 MG PO CAPS
500.0000 mg | ORAL_CAPSULE | Freq: Three times a day (TID) | ORAL | 0 refills | Status: AC
  Filled 2024-07-19: qty 6, 2d supply, fill #0

## 2024-07-19 MED ORDER — AMOXICILLIN 500 MG PO CAPS: 500.0000 mg | ORAL_CAPSULE | Freq: Three times a day (TID) | ORAL | Status: DC

## 2024-07-19 NOTE — Progress Notes (Signed)
 Discharge instructions reviewed with the patient. Patient was anxious about discharge. I assured her that everything she needed to know was on the discharge form. I also reiterated that she was to start the antibiotic (which was supplied by [facility] pharmacy and in the patients own black bag) Sat 07/20/24. Patient assisted by staff in to her car. Patients son loaded the wheelchair.

## 2024-07-19 NOTE — Plan of Care (Signed)
  Problem: Education: Goal: Knowledge of General Education information will improve Description: Including pain rating scale, medication(s)/side effects and non-pharmacologic comfort measures Outcome: Progressing   Problem: Health Behavior/Discharge Planning: Goal: Ability to manage health-related needs will improve Outcome: Progressing   Problem: Clinical Measurements: Goal: Ability to maintain clinical measurements within normal limits will improve Outcome: Progressing Goal: Will remain free from infection Outcome: Progressing Goal: Diagnostic test results will improve Outcome: Progressing Goal: Respiratory complications will improve Outcome: Progressing Goal: Cardiovascular complication will be avoided Outcome: Progressing   Problem: Coping: Goal: Level of anxiety will decrease Outcome: Progressing   Problem: Elimination: Goal: Will not experience complications related to bowel motility Outcome: Progressing Goal: Will not experience complications related to urinary retention Outcome: Progressing   Problem: Pain Managment: Goal: General experience of comfort will improve and/or be controlled Outcome: Progressing   Problem: Safety: Goal: Ability to remain free from injury will improve Outcome: Progressing

## 2024-07-19 NOTE — Discharge Summary (Signed)
 Physician Discharge Summary   Sydney REIERSON  female DOB: 23-Jun-1941  FMW:[id number]  PCP: Sharma Coyer, MD  Admit date: 07/17/2024 Discharge date: 07/19/2024  Admitted From: home Disposition:  home.  Declined SNF rehab Home Health: Yes CODE STATUS: Full code   Hospital Course:  For full details, please see H&P, progress notes, consult notes and ancillary notes.  Briefly,  Sydney Vaughan is a pleasant 83 y.o. female with medical history significant for atrial fibrillation on Eliquis , TIA, obesity, history of ovarian cancer s/p hysterectomy, BRCA positive s/p bilateral mastectomy, morbid obesity who presented to ED with progressively worsening generalized weakness, intermittent vaginal bleeding.    Patient lives at home with her son but stated that he is not able to help get her up when she is unable to stand.    Urinary tract infection vs vaginal bleeding - Patient used to have intermittent bleeding, mixed with urine - She stated that she has seen urologist and they advised for antibiotics for UTI otherwise no further intervention. - She was started on ceftriaxone  in the emergency room.  Sent to cultures. --seen by inpatient ObGyn, wet prep obtained, neg. --urine cx pos for proteus.  Pt received 3 days of ceftriaxone  and discharged on 2 more days of amox. --need to f/u with Gyn outpatient   Generalized weakness/deconditioning --pt wishes to go home with [facility] and wheel chair.   Paroxysmal Afib - Heart rate has been well-controlled. --cont atenolol  and Eliquis    Hypomagnesemia --monitored and supplemented PRN   Discharge Diagnoses:  Principal Problem:   Sepsis due to gram-negative UTI (HCC) Active Problems:   Hyperlipidemia with target LDL less than 70   TIA (transient ischemic attack)   Vaginal bleeding   30 Day Unplanned Readmission Risk Score    Flowsheet Row ED to Hosp-Admission (Current) from 07/17/2024 in [facility]  30 Day Unplanned Readmission Risk Score (%) 13.86 Filed at 07/17/2024 1600    This score is the patient's risk of an unplanned readmission within 30 days of being discharged (0 -100%). The score is based on dignosis, age, lab data, medications, orders, and past utilization.   Low:  0-14.9   Medium: 15-21.9   High: 22-29.9   Extreme: 30 and above         Discharge Instructions:  Allergies as of 07/19/2024   No Known Allergies      Medication List     STOP taking these medications    nitrofurantoin  (macrocrystal-monohydrate) 100 MG capsule Commonly known as: MACROBID        TAKE these medications    amoxicillin  500 MG capsule Commonly known as: AMOXIL  Take 1 capsule (500 mg total) by mouth 3 (three) times daily for 2 days. Start taking on: July 20, 2024   atenolol  50 MG tablet Commonly known as: TENORMIN  Take 0.5 tablet (25 mg) in the morning, and 1 tablet (50 mg) in the evening.   clonazePAM  0.5 MG tablet Commonly known as: KLONOPIN  Take 1 tablet (0.5 mg total) by mouth daily as needed. for anxiety   Eliquis  5 MG Tabs tablet Generic drug: apixaban  TAKE 1 TABLET BY MOUTH TWICE  DAILY   sertraline  25 MG tablet Commonly known as: ZOLOFT  TAKE 2 TABLETS BY MOUTH DAILY               Durable Medical Equipment  (From admission, onward)           Start     Ordered  07/17/24 1644  For home use only DME lightweight manual wheelchair with seat cushion  Once       Comments: Patient suffers from ovarian cancer and morbid obesity which impairs their ability to perform daily activities like bathing and grooming in the home.  A walker will not resolve  issue with performing activities of daily living. A wheelchair will allow patient to safely perform daily activities. Patient is not able to propel themselves in the home using a standard weight wheelchair due to general weakness. Patient can self propel in the lightweight wheelchair. Length of need  Lifetime. Accessories: elevating leg rests (ELRs), wheel locks, extensions and anti-tippers.   07/17/24 1644             Follow-up Information     Myron Nest, CNM Follow up.   Specialty: Certified Nurse Midwife Contact information: [address] [place] [address] [phone]         Sharma Coyer, MD Follow up in 1 week(s).   Specialty: Family Medicine Contact information: [address] [place] [address] [phone]                 No Known Allergies   The results of significant diagnostics from this hospitalization (including imaging, microbiology, ancillary and laboratory) are listed below for reference.   Consultations:   Procedures/Studies: CT HEAD WO CONTRAST ( ) Result Date: 07/17/2024 CLINICAL DATA:  Weakness EXAM: CT HEAD WITHOUT CONTRAST TECHNIQUE: Contiguous axial images were obtained from the base of the skull through the vertex without intravenous contrast. RADIATION DOSE REDUCTION: This exam was performed according to the departmental dose-optimization program which includes automated exposure control, adjustment of the mA and/or kV according to patient size and/or use of iterative reconstruction technique. 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 Chronic white matter ischemic changes are noted. No acute hemorrhage or acute infarct is seen. Vascular: No hyperdense vessel or unexpected calcification. Skull: Normal. Negative for fracture or focal lesion. Sinuses/Orbits: No acute finding. Other: None. IMPRESSION: Chronic changes without acute abnormality. Electronically Signed   By: Oneil Devonshire M.D.   On: 07/17/2024 03:26      Labs: BNP (last 3 results) No results for input(s): BNP in the last 8760 hours. Basic Metabolic Panel: Recent Labs  Lab 07/17/24 0248 07/18/24 0455  NA 141 140  K 3.5 3.7  CL 105 109  CO2 26 26  GLUCOSE 133*  101*  BUN 16 16  CREATININE 1.02* 0.79  CALCIUM  8.6* 8.2*  MG 1.5* 2.0   Liver Function Tests: Recent Labs  Lab 07/17/24 0248 07/18/24 0455  AST 13* 12*  ALT 8 6  ALKPHOS 82 62  BILITOT 0.7 0.9  PROT 6.9 5.9*  ALBUMIN 3.1* 2.6*   No results for input(s): LIPASE, AMYLASE in the last 168 hours. No results for input(s): AMMONIA in the last 168 hours. CBC: Recent Labs  Lab 07/17/24 0248 07/18/24 0455  WBC 8.9 6.3  NEUTROABS 6.1  --   HGB 13.1 10.9*  HCT 39.9 33.0*  MCV 95.9 95.7  PLT 203 138*   Cardiac Enzymes: No results for input(s): CKTOTAL, CKMB, CKMBINDEX, TROPONINI in the last 168 hours. BNP: Invalid input(s): POCBNP CBG: Recent Labs  Lab 07/18/24 2113  GLUCAP 151*   D-Dimer No results for input(s): DDIMER in the last 72 hours. Hgb A1c No results for input(s): HGBA1C in the last 72 hours. Lipid Profile No results for input(s): CHOL, HDL, LDLCALC, TRIG, CHOLHDL,  LDLDIRECT in the last 72 hours. Thyroid  function studies Recent Labs    07/17/24 0248  TSH 6.004*   Anemia work up No results for input(s): VITAMINB12, FOLATE, FERRITIN, TIBC, IRON, RETICCTPCT in the last 72 hours. Urinalysis    Component Value Date/Time   COLORURINE YELLOW (A) 07/17/2024 0309   APPEARANCEUR HAZY (A) 07/17/2024 0309   APPEARANCEUR Hazy (A) 10/25/2023 1459   LABSPEC 1.018 07/17/2024 0309   PHURINE 7.0 07/17/2024 0309   GLUCOSEU NEGATIVE 07/17/2024 0309   HGBUR MODERATE (A) 07/17/2024 0309   BILIRUBINUR NEGATIVE 07/17/2024 0309   BILIRUBINUR Negative 10/25/2023 1459   KETONESUR NEGATIVE 07/17/2024 0309   PROTEINUR NEGATIVE 07/17/2024 0309   NITRITE NEGATIVE 07/17/2024 0309   LEUKOCYTESUR LARGE (A) 07/17/2024 0309   Sepsis Labs Recent Labs  Lab 07/17/24 0248 07/18/24 0455  WBC 8.9 6.3   Microbiology Recent Results (from the past 240 hours)  Urine Culture     Status: Abnormal   Collection Time: 07/17/24  3:09 AM    Specimen: Urine, Random  Result Value Ref Range Status   Specimen Description   Final    URINE, RANDOM Performed at [facility], [address] [place], [place] [address]    Special Requests   Final    NONE Reflexed from [id number] Performed at [facility], [address] [place], [place] [address]    Culture >=100,000 COLONIES/mL PROTEUS MIRABILIS (A)  Final   Report Status 07/19/2024 FINAL  Final   Organism ID, Bacteria PROTEUS MIRABILIS (A)  Final      Susceptibility   Proteus mirabilis - MIC*    AMPICILLIN <=2 SENSITIVE Sensitive     CEFAZOLIN (URINE) Value in next row Sensitive      2 SENSITIVEThis is a modified FDA-approved test that has been validated and its performance characteristics determined by the reporting laboratory.  This laboratory is certified under the Clinical Laboratory Improvement Amendments CLIA as qualified to perform high complexity clinical laboratory testing.    CEFEPIME Value in next row Sensitive      2 SENSITIVEThis is a modified FDA-approved test that has been validated and its performance characteristics determined by the reporting laboratory.  This laboratory is certified under the Clinical Laboratory Improvement Amendments CLIA as qualified to perform high complexity clinical laboratory testing.    ERTAPENEM Value in next row Sensitive      2 SENSITIVEThis is a modified FDA-approved test that has been validated and its performance characteristics determined by the reporting laboratory.  This laboratory is certified under the Clinical Laboratory Improvement Amendments CLIA as qualified to perform high complexity clinical laboratory testing.    CEFTRIAXONE  Value in next row Sensitive      2 SENSITIVEThis is a modified FDA-approved test that has been validated and its performance characteristics determined by the reporting laboratory.  This laboratory is certified under the Clinical Laboratory Improvement Amendments CLIA as qualified to  perform high complexity clinical laboratory testing.    CIPROFLOXACIN Value in next row Sensitive      2 SENSITIVEThis is a modified FDA-approved test that has been validated and its performance characteristics determined by the reporting laboratory.  This laboratory is certified under the Clinical Laboratory Improvement Amendments CLIA as qualified to perform high complexity clinical laboratory testing.    GENTAMICIN Value in next row Sensitive      2 SENSITIVEThis is a modified FDA-approved test that has been validated and its performance characteristics determined by the reporting laboratory.  This laboratory is certified  under the Clinical Laboratory Improvement Amendments CLIA as qualified to perform high complexity clinical laboratory testing.    NITROFURANTOIN  Value in next row Resistant      2 SENSITIVEThis is a modified FDA-approved test that has been validated and its performance characteristics determined by the reporting laboratory.  This laboratory is certified under the Clinical Laboratory Improvement Amendments CLIA as qualified to perform high complexity clinical laboratory testing.    TRIMETH/SULFA Value in next row Sensitive      2 SENSITIVEThis is a modified FDA-approved test that has been validated and its performance characteristics determined by the reporting laboratory.  This laboratory is certified under the Clinical Laboratory Improvement Amendments CLIA as qualified to perform high complexity clinical laboratory testing.    AMPICILLIN/SULBACTAM Value in next row Sensitive      2 SENSITIVEThis is a modified FDA-approved test that has been validated and its performance characteristics determined by the reporting laboratory.  This laboratory is certified under the Clinical Laboratory Improvement Amendments CLIA as qualified to perform high complexity clinical laboratory testing.    PIP/TAZO Value in next row Sensitive ug/mL     <=4 SENSITIVEThis is a modified FDA-approved test that  has been validated and its performance characteristics determined by the reporting laboratory.  This laboratory is certified under the Clinical Laboratory Improvement Amendments CLIA as qualified to perform high complexity clinical laboratory testing.    MEROPENEM Value in next row Sensitive      <=4 SENSITIVEThis is a modified FDA-approved test that has been validated and its performance characteristics determined by the reporting laboratory.  This laboratory is certified under the Clinical Laboratory Improvement Amendments CLIA as qualified to perform high complexity clinical laboratory testing.    * >=100,000 COLONIES/mL PROTEUS MIRABILIS  Wet prep, genital     Status: None   Collection Time: 07/17/24 12:00 PM   Specimen: Vaginal  Result Value Ref Range Status   Yeast Wet Prep HPF POC NONE SEEN NONE SEEN Final   Trich, Wet Prep NONE SEEN NONE SEEN Final   Clue Cells Wet Prep HPF POC NONE SEEN NONE SEEN Final   WBC, Wet Prep HPF POC <10 <10 Final   Sperm NONE SEEN  Final    Comment: Performed at [facility], [address] [place], [place] [address]     Total time spend on discharging this patient, including the last patient exam, discussing the hospital stay, instructions for ongoing care as it relates to all pertinent caregivers, as well as preparing the medical discharge records, prescriptions, and/or referrals as applicable, is 35 minutes.    Ellouise Haber, MD  [facility] 07/19/2024, 12:50 PM

## 2024-07-19 NOTE — TOC Transition Note (Signed)
 Transition of Care [facility]) - Discharge Note   Patient Details  Name: Sydney Vaughan MRN: [id number] Date of Birth: 01/20/1941  [facility] CM/SW Contact:  Corean ONEIDA Haddock, RN Phone Number: 07/19/2024, 12:59 PM   Clinical Narrative:    Patient to discharge today Sydney Vaughan with [facility] notified of discharge         Patient Goals and CMS Choice            Discharge Placement                       Discharge Plan and Services Additional resources added to the After Visit Summary for                                       Social Drivers of Health (SDOH) Interventions SDOH Screenings   Food Insecurity: No Food Insecurity (07/17/2024)  Housing: Low Risk  (07/17/2024)  Transportation Needs: No Transportation Needs (07/17/2024)  Utilities: Not At Risk (07/17/2024)  Alcohol Screen: Low Risk  (06/26/2024)  Depression (PHQ2-9): Low Risk  (07/04/2024)  Financial Resource Strain: Low Risk  (06/26/2024)  Physical Activity: Inactive (06/26/2024)  Social Connections: Socially Isolated (07/17/2024)  Stress: No Stress Concern Present (06/26/2024)  Tobacco Use: Medium Risk (07/17/2024)  Health Literacy: Adequate Health Literacy (06/26/2024)     Readmission Risk Interventions     No data to display

## 2024-07-24 ENCOUNTER — Inpatient Hospital Stay: Admitting: Family Medicine

## 2024-07-26 ENCOUNTER — Telehealth: Payer: Self-pay

## 2024-07-26 DIAGNOSIS — Z7901 Long term (current) use of anticoagulants: Secondary | ICD-10-CM | POA: Diagnosis not present

## 2024-07-26 DIAGNOSIS — Z9181 History of falling: Secondary | ICD-10-CM | POA: Diagnosis not present

## 2024-07-26 DIAGNOSIS — N3090 Cystitis, unspecified without hematuria: Secondary | ICD-10-CM | POA: Diagnosis not present

## 2024-07-26 DIAGNOSIS — E785 Hyperlipidemia, unspecified: Secondary | ICD-10-CM | POA: Diagnosis not present

## 2024-07-26 DIAGNOSIS — Z8673 Personal history of transient ischemic attack (TIA), and cerebral infarction without residual deficits: Secondary | ICD-10-CM | POA: Diagnosis not present

## 2024-07-26 DIAGNOSIS — A4150 Gram-negative sepsis, unspecified: Secondary | ICD-10-CM | POA: Diagnosis not present

## 2024-07-26 DIAGNOSIS — I480 Paroxysmal atrial fibrillation: Secondary | ICD-10-CM | POA: Diagnosis not present

## 2024-07-26 NOTE — Telephone Encounter (Signed)
 Advised

## 2024-07-26 NOTE — Telephone Encounter (Signed)
Ok for verbal orders.    Sydney Lacks Simmons-Robinson, MD  [facility]  

## 2024-07-26 NOTE — Telephone Encounter (Signed)
 Copied from CRM [id number]. Topic: Clinical - Home Health Verbal Orders >> Jul 26, 2024 12:23 PM DeAngela L wrote: Caller/Agency: [place] calling with [facility]   Callback Number: [phone] secured line  Service Requested: Physical Therapy Frequency: 2w3  1w5 Any new concerns about the patient? Yes, patient did fall yesterday but she was not hurt

## 2024-07-29 DIAGNOSIS — A4150 Gram-negative sepsis, unspecified: Secondary | ICD-10-CM | POA: Diagnosis not present

## 2024-07-29 DIAGNOSIS — Z9181 History of falling: Secondary | ICD-10-CM | POA: Diagnosis not present

## 2024-07-29 DIAGNOSIS — Z7901 Long term (current) use of anticoagulants: Secondary | ICD-10-CM | POA: Diagnosis not present

## 2024-07-29 DIAGNOSIS — I480 Paroxysmal atrial fibrillation: Secondary | ICD-10-CM | POA: Diagnosis not present

## 2024-07-29 DIAGNOSIS — E785 Hyperlipidemia, unspecified: Secondary | ICD-10-CM | POA: Diagnosis not present

## 2024-07-29 DIAGNOSIS — Z8673 Personal history of transient ischemic attack (TIA), and cerebral infarction without residual deficits: Secondary | ICD-10-CM | POA: Diagnosis not present

## 2024-07-29 DIAGNOSIS — N3090 Cystitis, unspecified without hematuria: Secondary | ICD-10-CM | POA: Diagnosis not present

## 2024-07-30 DIAGNOSIS — N3090 Cystitis, unspecified without hematuria: Secondary | ICD-10-CM | POA: Diagnosis not present

## 2024-07-30 DIAGNOSIS — I480 Paroxysmal atrial fibrillation: Secondary | ICD-10-CM | POA: Diagnosis not present

## 2024-07-30 DIAGNOSIS — Z8673 Personal history of transient ischemic attack (TIA), and cerebral infarction without residual deficits: Secondary | ICD-10-CM | POA: Diagnosis not present

## 2024-07-30 DIAGNOSIS — A4150 Gram-negative sepsis, unspecified: Secondary | ICD-10-CM | POA: Diagnosis not present

## 2024-07-30 DIAGNOSIS — E785 Hyperlipidemia, unspecified: Secondary | ICD-10-CM | POA: Diagnosis not present

## 2024-07-30 DIAGNOSIS — Z9181 History of falling: Secondary | ICD-10-CM | POA: Diagnosis not present

## 2024-07-30 DIAGNOSIS — Z7901 Long term (current) use of anticoagulants: Secondary | ICD-10-CM | POA: Diagnosis not present

## 2024-07-31 DIAGNOSIS — Z8673 Personal history of transient ischemic attack (TIA), and cerebral infarction without residual deficits: Secondary | ICD-10-CM | POA: Diagnosis not present

## 2024-07-31 DIAGNOSIS — E785 Hyperlipidemia, unspecified: Secondary | ICD-10-CM | POA: Diagnosis not present

## 2024-07-31 DIAGNOSIS — I480 Paroxysmal atrial fibrillation: Secondary | ICD-10-CM | POA: Diagnosis not present

## 2024-07-31 DIAGNOSIS — A4150 Gram-negative sepsis, unspecified: Secondary | ICD-10-CM | POA: Diagnosis not present

## 2024-07-31 DIAGNOSIS — Z7901 Long term (current) use of anticoagulants: Secondary | ICD-10-CM | POA: Diagnosis not present

## 2024-07-31 DIAGNOSIS — Z9181 History of falling: Secondary | ICD-10-CM | POA: Diagnosis not present

## 2024-07-31 DIAGNOSIS — N3090 Cystitis, unspecified without hematuria: Secondary | ICD-10-CM | POA: Diagnosis not present

## 2024-08-01 ENCOUNTER — Telehealth: Payer: Self-pay | Admitting: Licensed Clinical Social Worker

## 2024-08-01 ENCOUNTER — Encounter: Payer: Self-pay | Admitting: Licensed Clinical Social Worker

## 2024-08-02 DIAGNOSIS — Z8673 Personal history of transient ischemic attack (TIA), and cerebral infarction without residual deficits: Secondary | ICD-10-CM | POA: Diagnosis not present

## 2024-08-02 DIAGNOSIS — Z7901 Long term (current) use of anticoagulants: Secondary | ICD-10-CM | POA: Diagnosis not present

## 2024-08-02 DIAGNOSIS — E785 Hyperlipidemia, unspecified: Secondary | ICD-10-CM | POA: Diagnosis not present

## 2024-08-02 DIAGNOSIS — I480 Paroxysmal atrial fibrillation: Secondary | ICD-10-CM | POA: Diagnosis not present

## 2024-08-02 DIAGNOSIS — A4150 Gram-negative sepsis, unspecified: Secondary | ICD-10-CM | POA: Diagnosis not present

## 2024-08-02 DIAGNOSIS — N3090 Cystitis, unspecified without hematuria: Secondary | ICD-10-CM | POA: Diagnosis not present

## 2024-08-02 DIAGNOSIS — Z9181 History of falling: Secondary | ICD-10-CM | POA: Diagnosis not present

## 2024-08-05 DIAGNOSIS — I480 Paroxysmal atrial fibrillation: Secondary | ICD-10-CM | POA: Diagnosis not present

## 2024-08-05 DIAGNOSIS — Z8673 Personal history of transient ischemic attack (TIA), and cerebral infarction without residual deficits: Secondary | ICD-10-CM | POA: Diagnosis not present

## 2024-08-05 DIAGNOSIS — N3090 Cystitis, unspecified without hematuria: Secondary | ICD-10-CM | POA: Diagnosis not present

## 2024-08-05 DIAGNOSIS — E785 Hyperlipidemia, unspecified: Secondary | ICD-10-CM | POA: Diagnosis not present

## 2024-08-05 DIAGNOSIS — Z7901 Long term (current) use of anticoagulants: Secondary | ICD-10-CM | POA: Diagnosis not present

## 2024-08-05 DIAGNOSIS — A4150 Gram-negative sepsis, unspecified: Secondary | ICD-10-CM | POA: Diagnosis not present

## 2024-08-05 DIAGNOSIS — Z9181 History of falling: Secondary | ICD-10-CM | POA: Diagnosis not present

## 2024-08-06 ENCOUNTER — Ambulatory Visit: Payer: Self-pay

## 2024-08-06 ENCOUNTER — Inpatient Hospital Stay: Admitting: Family Medicine

## 2024-08-06 NOTE — Telephone Encounter (Signed)
 Patient call note reporting fall reviewed. Request that patient continue to work with PT. Please schedule office visit if patient is reporting persistent pain after fall

## 2024-08-06 NOTE — Telephone Encounter (Signed)
 Pt advised. Verbalized understanding. Reports she is not in much pain at the moment but would like to keep apt for now. Advised if still no pain and no other concerns by time her appt date she can cancel up to 24 hrs before. She verbalized understanding

## 2024-08-06 NOTE — Telephone Encounter (Signed)
 FYI Only or Action Required?: FYI only for provider.  Patient was last seen in primary care on 07/25/2023 by Sharma Coyer, MD.  Called Nurse Triage reporting Fall.  Symptoms began yesterday.  Interventions attempted: Nothing.  Symptoms are: stable.  Triage Disposition: Home Care  Patient/caregiver understands and will follow disposition?: Yes      Copied from CRM [id number]. Topic: Clinical - Red Word Triage >> Aug 06, 2024  3:42 PM Tobias CROME wrote: Red Word that prompted transfer to Nurse Triage: Patient had a fall. Physical therapist calling to report. Reason for Disposition  [1] Recent fall AND [2] no injury  Answer Assessment - Initial Assessment Questions 1. MECHANISM: How did the fall happen?     Physical therapist called to report fall, she was trying to get off of toilet and son had to lower her to the ground 2. DOMESTIC VIOLENCE AND ELDER ABUSE SCREENING: Did you fall because someone pushed you or tried to hurt you? If Yes, ask: Are you safe now?     denies 3. ONSET: When did the fall happen? (e.g., minutes, hours, or days ago)     yesterday 4. LOCATION: What part of the body hit the ground? (e.g., back, buttocks, head, hips, knees, hands, head, stomach)     Denies injury 5. INJURY: Did you hurt (injure) yourself when you fell? If Yes, ask: What did you injure? Tell me more about this? (e.g., body area; type of injury; pain severity)     denies 6. PAIN: Is there any pain? If Yes, ask: How bad is the pain? (e.g., Scale 0-10; or none, mild,      denies 7. SIZE: For cuts, bruises, or swelling, ask: How large is it? (e.g., inches or centimeters)      na 8. PREGNANCY: Is there any chance you are pregnant? When was your last menstrual period?     na 9. OTHER SYMPTOMS: Do you have any other symptoms? (e.g., dizziness, fever, weakness; new-onset or worsening).      denies 10. CAUSE: What do you think caused the fall (or falling)?  (e.g., dizzy spell, tripped)       Deconditioning.  Protocols used: Falls and [facility]

## 2024-08-07 DIAGNOSIS — A4150 Gram-negative sepsis, unspecified: Secondary | ICD-10-CM | POA: Diagnosis not present

## 2024-08-07 DIAGNOSIS — Z9181 History of falling: Secondary | ICD-10-CM | POA: Diagnosis not present

## 2024-08-07 DIAGNOSIS — I480 Paroxysmal atrial fibrillation: Secondary | ICD-10-CM | POA: Diagnosis not present

## 2024-08-07 DIAGNOSIS — E785 Hyperlipidemia, unspecified: Secondary | ICD-10-CM | POA: Diagnosis not present

## 2024-08-07 DIAGNOSIS — Z8673 Personal history of transient ischemic attack (TIA), and cerebral infarction without residual deficits: Secondary | ICD-10-CM | POA: Diagnosis not present

## 2024-08-07 DIAGNOSIS — N3090 Cystitis, unspecified without hematuria: Secondary | ICD-10-CM | POA: Diagnosis not present

## 2024-08-07 DIAGNOSIS — Z7901 Long term (current) use of anticoagulants: Secondary | ICD-10-CM | POA: Diagnosis not present

## 2024-08-08 DIAGNOSIS — I480 Paroxysmal atrial fibrillation: Secondary | ICD-10-CM | POA: Diagnosis not present

## 2024-08-08 DIAGNOSIS — E785 Hyperlipidemia, unspecified: Secondary | ICD-10-CM | POA: Diagnosis not present

## 2024-08-08 DIAGNOSIS — A4150 Gram-negative sepsis, unspecified: Secondary | ICD-10-CM | POA: Diagnosis not present

## 2024-08-08 DIAGNOSIS — Z7901 Long term (current) use of anticoagulants: Secondary | ICD-10-CM | POA: Diagnosis not present

## 2024-08-08 DIAGNOSIS — N3090 Cystitis, unspecified without hematuria: Secondary | ICD-10-CM | POA: Diagnosis not present

## 2024-08-08 DIAGNOSIS — Z8673 Personal history of transient ischemic attack (TIA), and cerebral infarction without residual deficits: Secondary | ICD-10-CM | POA: Diagnosis not present

## 2024-08-08 DIAGNOSIS — Z9181 History of falling: Secondary | ICD-10-CM | POA: Diagnosis not present

## 2024-08-09 DIAGNOSIS — Z8673 Personal history of transient ischemic attack (TIA), and cerebral infarction without residual deficits: Secondary | ICD-10-CM | POA: Diagnosis not present

## 2024-08-09 DIAGNOSIS — A4150 Gram-negative sepsis, unspecified: Secondary | ICD-10-CM | POA: Diagnosis not present

## 2024-08-09 DIAGNOSIS — I480 Paroxysmal atrial fibrillation: Secondary | ICD-10-CM | POA: Diagnosis not present

## 2024-08-09 DIAGNOSIS — N3090 Cystitis, unspecified without hematuria: Secondary | ICD-10-CM | POA: Diagnosis not present

## 2024-08-09 DIAGNOSIS — Z8543 Personal history of malignant neoplasm of ovary: Secondary | ICD-10-CM

## 2024-08-09 DIAGNOSIS — F32A Depression, unspecified: Secondary | ICD-10-CM

## 2024-08-09 DIAGNOSIS — F419 Anxiety disorder, unspecified: Secondary | ICD-10-CM

## 2024-08-09 DIAGNOSIS — N939 Abnormal uterine and vaginal bleeding, unspecified: Secondary | ICD-10-CM | POA: Diagnosis not present

## 2024-08-09 DIAGNOSIS — Z6835 Body mass index (BMI) 35.0-35.9, adult: Secondary | ICD-10-CM

## 2024-08-09 DIAGNOSIS — E785 Hyperlipidemia, unspecified: Secondary | ICD-10-CM | POA: Diagnosis not present

## 2024-08-13 DIAGNOSIS — A4150 Gram-negative sepsis, unspecified: Secondary | ICD-10-CM | POA: Diagnosis not present

## 2024-08-13 DIAGNOSIS — E785 Hyperlipidemia, unspecified: Secondary | ICD-10-CM | POA: Diagnosis not present

## 2024-08-13 DIAGNOSIS — Z9181 History of falling: Secondary | ICD-10-CM | POA: Diagnosis not present

## 2024-08-13 DIAGNOSIS — I480 Paroxysmal atrial fibrillation: Secondary | ICD-10-CM | POA: Diagnosis not present

## 2024-08-13 DIAGNOSIS — Z7901 Long term (current) use of anticoagulants: Secondary | ICD-10-CM | POA: Diagnosis not present

## 2024-08-13 DIAGNOSIS — Z8673 Personal history of transient ischemic attack (TIA), and cerebral infarction without residual deficits: Secondary | ICD-10-CM | POA: Diagnosis not present

## 2024-08-13 DIAGNOSIS — N3090 Cystitis, unspecified without hematuria: Secondary | ICD-10-CM | POA: Diagnosis not present

## 2024-08-14 ENCOUNTER — Other Ambulatory Visit: Payer: Self-pay | Admitting: Licensed Clinical Social Worker

## 2024-08-14 DIAGNOSIS — Z7901 Long term (current) use of anticoagulants: Secondary | ICD-10-CM | POA: Diagnosis not present

## 2024-08-14 DIAGNOSIS — N3090 Cystitis, unspecified without hematuria: Secondary | ICD-10-CM | POA: Diagnosis not present

## 2024-08-14 DIAGNOSIS — E785 Hyperlipidemia, unspecified: Secondary | ICD-10-CM | POA: Diagnosis not present

## 2024-08-14 DIAGNOSIS — Z9181 History of falling: Secondary | ICD-10-CM | POA: Diagnosis not present

## 2024-08-14 DIAGNOSIS — Z8673 Personal history of transient ischemic attack (TIA), and cerebral infarction without residual deficits: Secondary | ICD-10-CM | POA: Diagnosis not present

## 2024-08-14 DIAGNOSIS — I480 Paroxysmal atrial fibrillation: Secondary | ICD-10-CM | POA: Diagnosis not present

## 2024-08-14 DIAGNOSIS — A4150 Gram-negative sepsis, unspecified: Secondary | ICD-10-CM | POA: Diagnosis not present

## 2024-08-14 NOTE — Patient Outreach (Signed)
 Complex Care Management   Visit Note  08/14/2024  Name:  Sydney Vaughan MRN: [id number] DOB: Jun 23, 1941  Situation: Referral received for Complex Care Management related to Mental/Behavioral Health diagnosis GAD I obtained verbal consent from Patient.  Visit completed with Patient  on the phone  Background:   Past Medical History:  Diagnosis Date   AMS (altered mental status) 03/22/2022   Anxiety    Atrial fibrillation with rapid ventricular response (HCC) 03/2022   New diagnosis setting of UTI and TIA.-Next seen on monitor.   Depression    History of cardioembolic stroke    History of tobacco abuse    Hyperlipidemia with target LDL less than 70    Unfortunately, not willing to take statin.  Did not start statin that was ordered in the hospital.  She is very fearful of side effects.   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   TIA due to embolism (HCC) 03/2022    Assessment: Patient Reported Symptoms:  Cognitive Cognitive Status: Alert and oriented to person, place, and time, Normal speech and language skills, Insightful and able to interpret abstract concepts Cognitive/Intellectual Conditions Management [RPT]: None reported or documented in medical history or problem list   Health Maintenance Behaviors: Annual physical exam, Stress management Healing Pattern: Unsure Health Facilitated by: Stress management  Neurological Neurological Review of Symptoms: No symptoms reported    HEENT HEENT Symptoms Reported: No symptoms reported      Cardiovascular Cardiovascular Symptoms Reported: No symptoms reported    Respiratory Respiratory Symptoms Reported: No symptoms reported    Endocrine Endocrine Symptoms Reported: No symptoms reported    Gastrointestinal Gastrointestinal Symptoms Reported: No symptoms reported      Genitourinary Genitourinary Symptoms Reported: No symptoms reported     Integumentary Integumentary Symptoms Reported: No symptoms reported    Musculoskeletal Musculoskelatal Symptoms Reviewed: No symptoms reported        Psychosocial Psychosocial Symptoms Reported: Anxiety - if selected complete GAD Behavioral Management Strategies: Activity, Medication therapy, Coping strategies Behavioral Health Self-Management Outcome: 4 (good) Major Change/Loss/Stressor/Fears (CP): Resources, Medical condition, self Techniques to Cope with Loss/Stress/Change: Diversional activities, Medication      08/14/2024    PHQ2-9 Depression Screening   Little interest or pleasure in doing things Not at all  Feeling down, depressed, or hopeless Not at all  PHQ-2 - Total Score 0  Trouble falling or staying asleep, or sleeping too much    Feeling tired or having little energy    Poor appetite or overeating     Feeling bad about yourself - or that you are a failure or have let yourself or your family down    Trouble concentrating on things, such as reading the newspaper or watching television    Moving or speaking so slowly that other people could have noticed.  Or the opposite - being so fidgety or restless that you have been moving around a lot more than usual    Thoughts that you would be better off dead, or hurting yourself in some way    PHQ2-9 Total Score    If you checked off any problems, how difficult have these problems made it for you to do your work, take care of things at home, or get along with other people    Depression Interventions/Treatment      There were no vitals filed for this visit.  Medications Reviewed Today  Reviewed by Kit Alm DELENA KEN (Social Worker) on 08/14/24 at 1008  Med List Status: <None>   Medication Order Taking? Sig Documenting Provider Last Dose Status Informant  atenolol  (TENORMIN ) 50 MG tablet [id number] Yes Take 0.5 tablet (25 mg) in the morning, and 1 tablet (50 mg) in the evening. Anner Alm ORN, MD  Active Self            Med Note [facility]   Thu Apr 11, 2024  3:58 PM) Reports dose changed to half in the morning and half in the evening  clonazePAM  (KLONOPIN ) 0.5 MG tablet [id number] Yes Take 1 tablet (0.5 mg total) by mouth daily as needed. for anxiety Simmons-Robinson, Rockie, MD  Active Self  ELIQUIS  5 MG TABS tablet [id number] Yes TAKE 1 TABLET BY MOUTH TWICE  DAILY Anner Alm ORN, MD  Active Self           Med Note BEVERLEE, JOYCE   Wed Jul 17, 2024  6:47 AM) Pt taking at noon and midnight  sertraline  (ZOLOFT ) 25 MG tablet [id number] Yes TAKE 2 TABLETS BY MOUTH DAILY Simmons-Robinson, Makiera, MD  Active Self            Recommendation:   Continue Current Plan of Care  Follow Up Plan:   Patient has met all care management goals. Care Management case will be closed. Patient has been provided contact information should new needs arise.   Alm Kit, LCSW [facility], [facility] Licensed Clinical Social Worker Care Coordinator [phone]

## 2024-08-14 NOTE — Patient Instructions (Signed)
 Visit Information  Thank you for taking time to visit with me today. Please don't hesitate to contact me if I can be of assistance to you before our next scheduled appointment.  Your next care management appointment is no further scheduled appointments.   Please call the care guide team at [phone] if you need to cancel, schedule, or reschedule an appointment.   Please call the Suicide and Crisis Lifeline: 988 if you are experiencing a Mental Health or Behavioral Health Crisis or need someone to talk to.  Hale Level, LCSW [facility], [facility] Licensed Clinical Social Worker Care Coordinator [phone]

## 2024-08-18 ENCOUNTER — Other Ambulatory Visit: Payer: Self-pay | Admitting: Cardiology

## 2024-08-18 DIAGNOSIS — I10 Essential (primary) hypertension: Secondary | ICD-10-CM

## 2024-08-19 ENCOUNTER — Inpatient Hospital Stay: Admitting: Family Medicine

## 2024-08-20 DIAGNOSIS — E785 Hyperlipidemia, unspecified: Secondary | ICD-10-CM | POA: Diagnosis not present

## 2024-08-20 DIAGNOSIS — N3090 Cystitis, unspecified without hematuria: Secondary | ICD-10-CM | POA: Diagnosis not present

## 2024-08-20 DIAGNOSIS — Z9181 History of falling: Secondary | ICD-10-CM | POA: Diagnosis not present

## 2024-08-20 DIAGNOSIS — Z8673 Personal history of transient ischemic attack (TIA), and cerebral infarction without residual deficits: Secondary | ICD-10-CM | POA: Diagnosis not present

## 2024-08-20 DIAGNOSIS — A4150 Gram-negative sepsis, unspecified: Secondary | ICD-10-CM | POA: Diagnosis not present

## 2024-08-20 DIAGNOSIS — Z7901 Long term (current) use of anticoagulants: Secondary | ICD-10-CM | POA: Diagnosis not present

## 2024-08-20 DIAGNOSIS — I480 Paroxysmal atrial fibrillation: Secondary | ICD-10-CM | POA: Diagnosis not present

## 2024-08-27 ENCOUNTER — Telehealth: Payer: Self-pay

## 2024-08-27 NOTE — Telephone Encounter (Unsigned)
 Copied from CRM [id number]. Topic: Clinical - Home Health Verbal Orders >> Aug 27, 2024  1:30 PM Emylou G wrote: Caller/Agency: Glacy w/[facility]  Callback Number: [phone] secured line Service Requested: Occupational Therapy Frequency: extension 1w4 still working on ADL ( shower, toilet, getting up and down ) Any new concerns about the patient? No

## 2024-08-28 NOTE — Telephone Encounter (Signed)
 Copied from CRM [id number]. Topic: Clinical - Home Health Verbal Orders >> Aug 28, 2024 12:29 PM Emylou G wrote: They called back.. they are okay w/vmail for the secured line.. They need a decision today - due to being discharged - so they need extension to work on ADLs

## 2024-08-28 NOTE — Telephone Encounter (Signed)
Ok for verbal orders.    Andreya Lacks Simmons-Robinson, MD  [facility]  

## 2024-08-28 NOTE — Telephone Encounter (Signed)
 I called Glacy with [facility] and gave verbals orders ok per Dr.Robinson

## 2024-09-19 ENCOUNTER — Telehealth: Payer: Self-pay

## 2024-09-19 NOTE — Telephone Encounter (Signed)
 Copied from CRM #[id number]. Topic: Clinical - Home Health Verbal Orders >> Sep 19, 2024 10:24 AM Wess RAMAN wrote: Caller/Agency: Chris/ [facility] Callback Number: [phone] Service Requested: Physical Therapy Frequency: Once every 2 weeks Any new concerns about the patient? No

## 2024-09-19 NOTE — Telephone Encounter (Signed)
Ok for verbal orders.    Andreya Lacks Simmons-Robinson, MD  [facility]  

## 2024-09-19 NOTE — Telephone Encounter (Signed)
 Spoke with [place] to notify that verbal orders are okay.

## 2024-09-25 ENCOUNTER — Other Ambulatory Visit: Payer: Self-pay | Admitting: Cardiology

## 2024-09-25 DIAGNOSIS — I4891 Unspecified atrial fibrillation: Secondary | ICD-10-CM

## 2024-09-25 MED ORDER — APIXABAN 5 MG PO TABS: 5.0000 mg | ORAL_TABLET | Freq: Two times a day (BID) | ORAL | 0 refills | Status: DC

## 2024-09-25 NOTE — Telephone Encounter (Signed)
 Unable to LVM to schedule mailbox is full

## 2024-09-25 NOTE — Telephone Encounter (Signed)
 Prescription refill request for Eliquis  received. Indication:afib Last office visit:needs appt Scr: Age:  Weight:  Prescription refilled

## 2024-09-26 DIAGNOSIS — A4150 Gram-negative sepsis, unspecified: Secondary | ICD-10-CM | POA: Diagnosis not present

## 2024-09-26 DIAGNOSIS — I4811 Longstanding persistent atrial fibrillation: Secondary | ICD-10-CM | POA: Diagnosis not present

## 2024-09-26 DIAGNOSIS — Z8543 Personal history of malignant neoplasm of ovary: Secondary | ICD-10-CM

## 2024-09-26 DIAGNOSIS — F32A Depression, unspecified: Secondary | ICD-10-CM

## 2024-09-26 DIAGNOSIS — Z6835 Body mass index (BMI) 35.0-35.9, adult: Secondary | ICD-10-CM

## 2024-09-26 DIAGNOSIS — F411 Generalized anxiety disorder: Secondary | ICD-10-CM | POA: Diagnosis not present

## 2024-09-26 DIAGNOSIS — Z7901 Long term (current) use of anticoagulants: Secondary | ICD-10-CM

## 2024-09-26 DIAGNOSIS — R296 Repeated falls: Secondary | ICD-10-CM

## 2024-09-26 DIAGNOSIS — Z8673 Personal history of transient ischemic attack (TIA), and cerebral infarction without residual deficits: Secondary | ICD-10-CM

## 2024-09-26 DIAGNOSIS — E785 Hyperlipidemia, unspecified: Secondary | ICD-10-CM | POA: Diagnosis not present

## 2024-09-26 DIAGNOSIS — Z87440 Personal history of urinary (tract) infections: Secondary | ICD-10-CM

## 2024-10-03 ENCOUNTER — Telehealth: Payer: Self-pay

## 2024-10-03 DIAGNOSIS — F411 Generalized anxiety disorder: Secondary | ICD-10-CM

## 2024-10-03 NOTE — Telephone Encounter (Unsigned)
 Copied from CRM #[id number]. Topic: Clinical - Medication Refill >> Oct 03, 2024  2:10 PM Olam RAMAN wrote: Medication: clonazePAM  (KLONOPIN ) 0.5 MG tablet   Has the patient contacted their pharmacy? Yes (Agent: If no, request that the patient contact the pharmacy for the refill. If patient does not wish to contact the pharmacy document the reason why and proceed with request.) (Agent: If yes, when and what did the pharmacy advise?)  This is the patient's preferred pharmacy:  [facility] - [place], [place] - [address] [place] [address] Phone: [phone] Fax: [phone]  [facility] [address] [place] [address] Phone: [phone] Fax: [phone]  Is this the correct pharmacy for this prescription? Yes If no, delete pharmacy and type the correct one.   Has the prescription been filled recently? Yes  Is the patient out of the medication? Yes  Has the patient been seen for an appointment in the last year OR does the patient have an upcoming appointment? Yes  Can we respond through MyChart? No  Agent: Please be advised that Rx refills may take up to 3 business days. We ask that you follow-up with your pharmacy.

## 2024-10-03 NOTE — Telephone Encounter (Signed)
 PT Telephone encounter reviewed.

## 2024-10-03 NOTE — Telephone Encounter (Signed)
 Copied from CRM #[id number]. Topic: General - Other >> Oct 03, 2024 11:40 AM Zebedee SAUNDERS wrote: Reason for CRM: Received call from [facility] per Robin ph: [phone] appt for 10/02/2024 pt and son are sick. Pt will be seen next week.

## 2024-10-07 ENCOUNTER — Other Ambulatory Visit: Payer: Self-pay

## 2024-10-07 MED ORDER — CLONAZEPAM 0.5 MG PO TABS
0.5000 mg | ORAL_TABLET | Freq: Every day | ORAL | 0 refills | Status: DC | PRN
  Filled 2024-10-07: qty 30, 30d supply, fill #0

## 2024-10-07 NOTE — Telephone Encounter (Signed)
 Rx sent to requested pharmacy.  0 refills

## 2024-10-07 NOTE — Addendum Note (Signed)
 Addended by: SIMMONS-ROBINSON, Likisha Alles L on: 10/07/2024 08:29 AM   Modules accepted: Orders

## 2024-10-10 ENCOUNTER — Telehealth: Payer: Self-pay

## 2024-10-10 ENCOUNTER — Other Ambulatory Visit: Payer: Self-pay | Admitting: Family Medicine

## 2024-10-10 DIAGNOSIS — F411 Generalized anxiety disorder: Secondary | ICD-10-CM

## 2024-10-10 NOTE — Telephone Encounter (Signed)
 Copied from CRM #[id number]. Topic: Clinical - Prescription Issue >> Oct 10, 2024  3:36 PM Joesph NOVAK wrote: Reason for CRM: [facility] pharmacy needs provider to approve clonazePAM  0.5mg  before they can deliver it to patient. Patient is calling to follow up on this request.

## 2024-10-11 ENCOUNTER — Other Ambulatory Visit: Payer: Self-pay

## 2024-10-11 MED ORDER — CLONAZEPAM 0.5 MG PO TABS: 0.5000 mg | ORAL_TABLET | Freq: Every day | ORAL | 0 refills | Status: DC | PRN

## 2024-10-11 NOTE — Telephone Encounter (Signed)
 Prescription re-submitted to [facility]

## 2024-10-14 ENCOUNTER — Ambulatory Visit: Admitting: Family Medicine

## 2024-10-14 NOTE — Telephone Encounter (Signed)
 Pt advised. Verbalized understanding.

## 2024-10-15 NOTE — Telephone Encounter (Signed)
 Called but unable to leave voicemail. Call was picked up but no response.

## 2024-10-23 ENCOUNTER — Other Ambulatory Visit: Payer: Self-pay | Admitting: Cardiology

## 2024-10-23 DIAGNOSIS — I4891 Unspecified atrial fibrillation: Secondary | ICD-10-CM

## 2024-10-23 NOTE — Telephone Encounter (Signed)
 Prescription refill request for Eliquis  received. Indication:afib Last office visit:needs visit Scr: Age:  Weight: Prescription refilled

## 2024-10-23 NOTE — Telephone Encounter (Signed)
 LVM to schedule f/u appt

## 2024-10-29 ENCOUNTER — Other Ambulatory Visit: Payer: Self-pay

## 2024-10-29 DIAGNOSIS — I4891 Unspecified atrial fibrillation: Secondary | ICD-10-CM

## 2024-10-29 MED ORDER — APIXABAN 5 MG PO TABS: 5.0000 mg | ORAL_TABLET | Freq: Two times a day (BID) | ORAL | 0 refills | Status: DC

## 2024-10-30 NOTE — Telephone Encounter (Signed)
Scheduled 01/21

## 2024-11-22 ENCOUNTER — Other Ambulatory Visit: Payer: Self-pay | Admitting: Cardiology

## 2024-11-22 ENCOUNTER — Other Ambulatory Visit: Payer: Self-pay | Admitting: Family Medicine

## 2024-11-22 DIAGNOSIS — I4891 Unspecified atrial fibrillation: Secondary | ICD-10-CM

## 2024-11-22 DIAGNOSIS — F411 Generalized anxiety disorder: Secondary | ICD-10-CM

## 2024-11-22 NOTE — Telephone Encounter (Signed)
 Eliquis  5mg  refill request received. Patient is 84 years old, weight-112.1kg, Crea-0.79 on 07/18/24, Diagnosis-Afib, and last seen by Dr. Anner on 09/07/23 and pending appt with Maeola Lunch on 12/11/24. Dose is appropriate based on dosing criteria. Will send in refill to requested pharmacy.

## 2024-11-25 NOTE — Telephone Encounter (Signed)
 Requested medication (s) are due for refill today: yes  Requested medication (s) are on the active medication list: yes  Last refill:  10/11/24  Future visit scheduled: yes  Notes to clinic:  Unable to refill per protocol, cannot delegate.      Requested Prescriptions  Pending Prescriptions Disp Refills   clonazePAM  (KLONOPIN ) 0.5 MG tablet [Pharmacy Med Name: clonazePAM  0.5 MG Oral Tablet] 30 tablet     Sig: TAKE 1 TABLET BY MOUTH DAILY AS  NEEDED FOR ANXIETY     Not Delegated - Psychiatry: Anxiolytics/Hypnotics 2 Failed - 11/25/2024 12:01 PM      Failed - This refill cannot be delegated      Failed - Urine Drug Screen completed in last 360 days      Passed - Patient is not pregnant      Passed - Valid encounter within last 6 months    Recent Outpatient Visits   None     Future Appointments             In 2 weeks Hammock, Tylene, NP [facility] [place]

## 2024-12-03 ENCOUNTER — Telehealth: Payer: Self-pay | Admitting: Cardiology

## 2024-12-03 ENCOUNTER — Other Ambulatory Visit: Payer: Self-pay | Admitting: Family Medicine

## 2024-12-03 DIAGNOSIS — F411 Generalized anxiety disorder: Secondary | ICD-10-CM

## 2024-12-03 DIAGNOSIS — I4891 Unspecified atrial fibrillation: Secondary | ICD-10-CM

## 2024-12-03 DIAGNOSIS — F32A Depression, unspecified: Secondary | ICD-10-CM

## 2024-12-03 DIAGNOSIS — I10 Essential (primary) hypertension: Secondary | ICD-10-CM

## 2024-12-03 MED ORDER — APIXABAN 5 MG PO TABS: 5.0000 mg | ORAL_TABLET | Freq: Two times a day (BID) | ORAL | 0 refills | Status: DC

## 2024-12-03 NOTE — Telephone Encounter (Signed)
 Patient was made aware that samples are available for pick-up. She noted that her son will pick them up.

## 2024-12-03 NOTE — Telephone Encounter (Unsigned)
 Copied from CRM #[id number]. Topic: Clinical - Medication Refill >> Dec 03, 2024 10:08 AM Tinnie C wrote: Medication:  atenolol  50mg - not necessarily needed yet, but wants to make sure it will be sent to the new pharmacy so she can pick up future refills there. sertaline 25 mg - needs refill soon clonazepam  0.5 mg - needs refill  Has the patient contacted their pharmacy? No She has not used this pharmacy before  This is the patient's preferred pharmacy:   [facility] - [place], [place] - [address] [place] [address] Phone: [phone] Fax: [phone]  Is this the correct pharmacy for this prescription? Yes If no, delete pharmacy and type the correct one.   Has the prescription been filled recently? Yes  Is the patient out of the medication? No  Has the patient been seen for an appointment in the last year OR does the patient have an upcoming appointment? Yes  Can we respond through MyChart? Please call [phone]  Agent: Please be advised that Rx refills may take up to 3 business days. We ask that you follow-up with your pharmacy.

## 2024-12-03 NOTE — Telephone Encounter (Signed)
 The patient called in stating she has gone back with [employer] and so she no longer uses [facility] but rather her local pharmacy. If her clonazePAM  (KLONOPIN ) 0.5 MG tablet can be redirected and if by any chance a #90 day supply be called in she would not owe anything then. She truly appreciates it. She uses  [facility] - [place], [place] - [address] Phone: [phone]  Fax: [phone]     Please assist patient further

## 2024-12-03 NOTE — Telephone Encounter (Signed)
 Patient calling the office for samples of medication:   1.  What medication and dosage are you requesting samples for? apixaban (ELIQUIS) 5 MG TABS tablet  2.  Are you currently out of this medication? Yes

## 2024-12-04 NOTE — Telephone Encounter (Signed)
 Requested medication (s) are due for refill today: yes  Requested medication (s) are on the active medication list: yes  Last refill:    Future visit scheduled: yes  Notes to clinic:  Unable to refill per protocol, cannot delegate. Last refill by another provider.       Requested Prescriptions  Pending Prescriptions Disp Refills   atenolol  (TENORMIN ) 50 MG tablet 135 tablet 0    Sig: TAKE ONE-HALF TABLET BY MOUTH IN THE MORNING AND 1 TABLET IN THE  EVENING     Cardiovascular: Beta Blockers 2 Passed - 12/04/2024 10:42 AM      Passed - Cr in normal range and within 360 days    Creatinine, Ser  Date Value Ref Range Status  07/18/2024 0.79 0.44 - 1.00 mg/dL Final         Passed - Last BP in normal range    BP Readings from Last 1 Encounters:  07/19/24 111/81         Passed - Last Heart Rate in normal range    Pulse Readings from Last 1 Encounters:  07/19/24 96         Passed - Valid encounter within last 6 months    Recent Outpatient Visits   None     Future Appointments             In 1 week Hammock, Tylene, NP [facility]             sertraline  (ZOLOFT ) 25 MG tablet 180 tablet 3    Sig: Take 2 tablets (50 mg total) by mouth daily.     Psychiatry:  Antidepressants - SSRI - sertraline  Failed - 12/04/2024 10:42 AM      Failed - AST in normal range and within 360 days    AST  Date Value Ref Range Status  07/18/2024 12 (L) 15 - 41 U/L Final         Passed - ALT in normal range and within 360 days    ALT  Date Value Ref Range Status  07/18/2024 6 0 - 44 U/L Final         Passed - Completed PHQ-2 or PHQ-9 in the last 360 days      Passed - Valid encounter within last 6 months    Recent Outpatient Visits   None     Future Appointments             In 1 week Hammock, Tylene, NP [facility]             clonazePAM  (KLONOPIN ) 0.5 MG tablet 30 tablet 0    Sig: Take 1 tablet (0.5 mg total) by mouth daily  as needed. for anxiety     Not Delegated - Psychiatry: Anxiolytics/Hypnotics 2 Failed - 12/04/2024 10:42 AM      Failed - This refill cannot be delegated      Failed - Urine Drug Screen completed in last 360 days      Passed - Patient is not pregnant      Passed - Valid encounter within last 6 months    Recent Outpatient Visits   None     Future Appointments             In 1 week Hammock, Tylene, NP [facility] [place]

## 2024-12-05 ENCOUNTER — Other Ambulatory Visit: Payer: Self-pay | Admitting: Cardiology

## 2024-12-05 DIAGNOSIS — I4891 Unspecified atrial fibrillation: Secondary | ICD-10-CM

## 2024-12-05 MED ORDER — APIXABAN 5 MG PO TABS: 5.0000 mg | ORAL_TABLET | Freq: Two times a day (BID) | ORAL | 0 refills | Status: AC

## 2024-12-11 ENCOUNTER — Ambulatory Visit: Payer: Medicare (Managed Care) | Admitting: Cardiology

## 2024-12-18 ENCOUNTER — Other Ambulatory Visit: Payer: Self-pay | Admitting: Family Medicine

## 2024-12-18 DIAGNOSIS — F411 Generalized anxiety disorder: Secondary | ICD-10-CM

## 2024-12-18 NOTE — Telephone Encounter (Signed)
 Copied from CRM [id number]. Topic: Clinical - Medication Refill >> Dec 18, 2024  8:13 AM Avram MATSU wrote: Medication: clonazePAM  (KLONOPIN ) 0.5 MG tablet [[id number]]  Has the patient contacted their pharmacy? Yes (Agent: If no, request that the patient contact the pharmacy for the refill. If patient does not wish to contact the pharmacy document the reason why and proceed with request.) (Agent: If yes, when and what did the pharmacy advise?)  This is the patient's preferred pharmacy:  [facility] - [place], [place] - [address] [place] [address] Phone: [phone] Fax: [phone]   Is this the correct pharmacy for this prescription? Yes If no, delete pharmacy and type the correct one.   Has the prescription been filled recently? No  Is the patient out of the medication? Yes  Has the patient been seen for an appointment in the last year OR does the patient have an upcoming appointment? Yes  Can we respond through MyChart? No  Agent: Please be advised that Rx refills may take up to 3 business days. We ask that you follow-up with your pharmacy.

## 2024-12-19 NOTE — Telephone Encounter (Signed)
 Requested medications are due for refill today.  no  Requested medications are on the active medications list.  yes  Last refill. 11/27/2024 #30 0 rf  Future visit scheduled.   yes  Notes to clinic.  Refill not delegated.    Requested Prescriptions  Pending Prescriptions Disp Refills   clonazePAM  (KLONOPIN ) 0.5 MG tablet 30 tablet 0    Sig: Take 1 tablet (0.5 mg total) by mouth daily as needed. for anxiety     Not Delegated - Psychiatry: Anxiolytics/Hypnotics 2 Failed - 12/19/2024 11:23 AM      Failed - This refill cannot be delegated      Failed - Urine Drug Screen completed in last 360 days      Passed - Patient is not pregnant      Passed - Valid encounter within last 6 months    Recent Outpatient Visits   None     Future Appointments             In 1 week Hammock, Tylene, NP [facility] [place]

## 2024-12-20 ENCOUNTER — Other Ambulatory Visit: Payer: Self-pay | Admitting: Family Medicine

## 2024-12-20 DIAGNOSIS — F411 Generalized anxiety disorder: Secondary | ICD-10-CM

## 2024-12-20 MED ORDER — CLONAZEPAM 0.5 MG PO TABS: 0.5000 mg | ORAL_TABLET | Freq: Every day | ORAL | 0 refills | Status: AC | PRN

## 2024-12-20 NOTE — Telephone Encounter (Signed)
 Refill sent to local pharmacy.   She will need to keep scheduled appointment in order for future refills to be approved.

## 2024-12-25 ENCOUNTER — Ambulatory Visit: Admitting: Family Medicine

## 2024-12-30 ENCOUNTER — Ambulatory Visit: Payer: Medicare (Managed Care) | Admitting: Cardiology

## 2025-01-10 ENCOUNTER — Ambulatory Visit: Admitting: Family Medicine

## 2025-07-02 ENCOUNTER — Ambulatory Visit
